# Patient Record
Sex: Male | Born: 1975 | Race: White | Hispanic: No | Marital: Single | Smoking: Never smoker
Health system: Southern US, Community
[De-identification: ages and names within clinical notes are randomized; demographics above are authoritative.]

## PROBLEM LIST (undated history)

## (undated) DIAGNOSIS — R200 Anesthesia of skin: Secondary | ICD-10-CM

## (undated) DIAGNOSIS — Z87442 Personal history of urinary calculi: Secondary | ICD-10-CM

## (undated) DIAGNOSIS — M5020 Other cervical disc displacement, unspecified cervical region: Secondary | ICD-10-CM

## (undated) DIAGNOSIS — N200 Calculus of kidney: Secondary | ICD-10-CM

## (undated) DIAGNOSIS — F32A Depression, unspecified: Secondary | ICD-10-CM

## (undated) DIAGNOSIS — R3129 Other microscopic hematuria: Secondary | ICD-10-CM

## (undated) DIAGNOSIS — I10 Essential (primary) hypertension: Secondary | ICD-10-CM

## (undated) DIAGNOSIS — G935 Compression of brain: Secondary | ICD-10-CM

## (undated) DIAGNOSIS — K649 Unspecified hemorrhoids: Secondary | ICD-10-CM

## (undated) DIAGNOSIS — R202 Paresthesia of skin: Secondary | ICD-10-CM

## (undated) DIAGNOSIS — K921 Melena: Secondary | ICD-10-CM

## (undated) DIAGNOSIS — G4730 Sleep apnea, unspecified: Secondary | ICD-10-CM

## (undated) DIAGNOSIS — K219 Gastro-esophageal reflux disease without esophagitis: Secondary | ICD-10-CM

## (undated) DIAGNOSIS — M5030 Other cervical disc degeneration, unspecified cervical region: Secondary | ICD-10-CM

## (undated) DIAGNOSIS — Z9889 Other specified postprocedural states: Secondary | ICD-10-CM

## (undated) DIAGNOSIS — M542 Cervicalgia: Secondary | ICD-10-CM

## (undated) HISTORY — PX: SUBOCCIPITAL CRANIECTOMY CERVICAL LAMINECTOMY: SHX5404

## (undated) HISTORY — PX: FRACTURE SURGERY: SHX138

## (undated) HISTORY — PX: OTHER SURGICAL HISTORY: SHX169

## (undated) HISTORY — PX: TONSILLECTOMY: SUR1361

## (undated) HISTORY — PX: SPINE SURGERY: SHX786

## (undated) HISTORY — PX: CHOLECYSTECTOMY: SHX55

## (undated) HISTORY — PX: COLONOSCOPY: SHX174

## (undated) HISTORY — PX: MUSCLE BIOPSY: SHX716

## (undated) HISTORY — PX: APPENDECTOMY: SHX54

## (undated) HISTORY — PX: LITHOTRIPSY: SUR834

## (undated) HISTORY — PX: KNEE SURGERY: SHX244

---

## 2009-02-23 ENCOUNTER — Emergency Department (HOSPITAL_BASED_OUTPATIENT_CLINIC_OR_DEPARTMENT_OTHER): Admission: EM | Admit: 2009-02-23 | Discharge: 2009-02-23 | Payer: Self-pay | Admitting: Emergency Medicine

## 2009-03-30 ENCOUNTER — Emergency Department (HOSPITAL_BASED_OUTPATIENT_CLINIC_OR_DEPARTMENT_OTHER): Admission: EM | Admit: 2009-03-30 | Discharge: 2009-03-30 | Payer: Self-pay | Admitting: Emergency Medicine

## 2009-04-01 ENCOUNTER — Ambulatory Visit: Payer: Self-pay | Admitting: Diagnostic Radiology

## 2009-04-01 ENCOUNTER — Ambulatory Visit (HOSPITAL_BASED_OUTPATIENT_CLINIC_OR_DEPARTMENT_OTHER): Admission: RE | Admit: 2009-04-01 | Discharge: 2009-04-01 | Payer: Self-pay | Admitting: Emergency Medicine

## 2009-07-04 ENCOUNTER — Ambulatory Visit (HOSPITAL_COMMUNITY): Admission: RE | Admit: 2009-07-04 | Discharge: 2009-07-04 | Payer: Self-pay | Admitting: Surgery

## 2010-07-13 LAB — URINALYSIS, ROUTINE W REFLEX MICROSCOPIC
Bilirubin Urine: NEGATIVE
Glucose, UA: NEGATIVE mg/dL
Hgb urine dipstick: NEGATIVE
Nitrite: NEGATIVE
Specific Gravity, Urine: 1.025 (ref 1.005–1.030)
Urobilinogen, UA: 0.2 mg/dL (ref 0.0–1.0)
pH: 5.5 (ref 5.0–8.0)

## 2010-07-13 LAB — DIFFERENTIAL
Basophils Absolute: 0 10*3/uL (ref 0.0–0.1)
Eosinophils Absolute: 0 10*3/uL (ref 0.0–0.7)
Lymphocytes Relative: 20 % (ref 12–46)
Monocytes Absolute: 0.5 10*3/uL (ref 0.1–1.0)
Neutro Abs: 5.3 10*3/uL (ref 1.7–7.7)
Neutrophils Relative %: 73 % (ref 43–77)

## 2010-07-13 LAB — COMPREHENSIVE METABOLIC PANEL
Alkaline Phosphatase: 67 U/L (ref 39–117)
BUN: 14 mg/dL (ref 6–23)
CO2: 29 mEq/L (ref 19–32)
Calcium: 9.6 mg/dL (ref 8.4–10.5)
GFR calc non Af Amer: >60 mL/min (ref >60)
Total Bilirubin: 0.9 mg/dL (ref 0.3–1.2)

## 2010-07-13 LAB — CBC
HCT: 47.4 % (ref 39.0–52.0)
RBC: 5.26 MIL/uL (ref 4.22–5.81)
WBC: 7.2 10*3/uL (ref 4.0–10.5)

## 2010-07-13 LAB — LIPASE, BLOOD: Lipase: 40 U/L (ref 23–300)

## 2012-06-23 DIAGNOSIS — K219 Gastro-esophageal reflux disease without esophagitis: Secondary | ICD-10-CM | POA: Insufficient documentation

## 2015-01-04 ENCOUNTER — Encounter: Payer: Self-pay | Admitting: *Deleted

## 2015-01-04 DIAGNOSIS — N200 Calculus of kidney: Secondary | ICD-10-CM | POA: Diagnosis not present

## 2015-01-04 DIAGNOSIS — R109 Unspecified abdominal pain: Secondary | ICD-10-CM | POA: Diagnosis present

## 2015-01-04 LAB — URINALYSIS COMPLETE WITH MICROSCOPIC (ARMC ONLY)
BILIRUBIN URINE: NEGATIVE
GLUCOSE, UA: NEGATIVE mg/dL
KETONES UR: NEGATIVE mg/dL
LEUKOCYTES UA: NEGATIVE
NITRITE: NEGATIVE
Protein, ur: NEGATIVE mg/dL
SPECIFIC GRAVITY, URINE: 1.02 (ref 1.005–1.030)
pH: 5 (ref 5.0–8.0)

## 2015-01-04 MED ORDER — OXYCODONE-ACETAMINOPHEN 5-325 MG PO TABS
1.0000 | ORAL_TABLET | Freq: Once | ORAL | Status: AC
  Administered 2015-01-04: 1 via ORAL
  Filled 2015-01-04: qty 1

## 2015-01-04 NOTE — ED Notes (Signed)
Spoke with Dr Zenda Alpers regarding pt's presenting complaint and UA results; verbal order for Percocet only at this time

## 2015-01-04 NOTE — ED Notes (Signed)
Pt reports flank pain severe since this afternoon. States trying to urinate, but only dribbles come out. Pain to right flank. Hx of kidney stones.

## 2015-01-04 NOTE — ED Notes (Signed)
Pt c/o low midline abd pain that radiates down into penis; pt says last kidney stone was obstructing, 6mm, had to have procedure to open the urethra so the stone would pass;

## 2015-01-05 ENCOUNTER — Emergency Department
Admission: EM | Admit: 2015-01-05 | Discharge: 2015-01-05 | Disposition: A | Discharge status: Home / Self Care | Payer: BLUE CROSS/BLUE SHIELD | Attending: Emergency Medicine | Admitting: Emergency Medicine

## 2015-01-05 ENCOUNTER — Emergency Department: Payer: BLUE CROSS/BLUE SHIELD

## 2015-01-05 DIAGNOSIS — N200 Calculus of kidney: Secondary | ICD-10-CM

## 2015-01-05 DIAGNOSIS — R109 Unspecified abdominal pain: Secondary | ICD-10-CM

## 2015-01-05 LAB — CBC WITH DIFFERENTIAL/PLATELET
Basophils Absolute: 0 10*3/uL (ref 0–0.1)
Basophils Relative: 1 %
Eosinophils Absolute: 0.1 10*3/uL (ref 0–0.7)
Eosinophils Relative: 2 %
HEMATOCRIT: 43.9 % (ref 40.0–52.0)
HEMOGLOBIN: 15 g/dL (ref 13.0–18.0)
LYMPHS ABS: 1.4 10*3/uL (ref 1.0–3.6)
Lymphocytes Relative: 29 %
MCH: 31.1 pg (ref 26.0–34.0)
MCHC: 34.2 g/dL (ref 32.0–36.0)
MCV: 91 fL (ref 80.0–100.0)
MONO ABS: 0.4 10*3/uL (ref 0.2–1.0)
MONOS PCT: 8 %
NEUTROS ABS: 3 10*3/uL (ref 1.4–6.5)
NEUTROS PCT: 60 %
Platelets: 132 10*3/uL — ABNORMAL LOW (ref 150–440)
RBC: 4.83 MIL/uL (ref 4.40–5.90)
RDW: 12.3 % (ref 11.5–14.5)
WBC: 4.9 10*3/uL (ref 3.8–10.6)

## 2015-01-05 LAB — BASIC METABOLIC PANEL
Anion gap: 6 (ref 5–15)
BUN: 17 mg/dL (ref 6–20)
CALCIUM: 9.3 mg/dL (ref 8.9–10.3)
CHLORIDE: 108 mmol/L (ref 101–111)
CO2: 26 mmol/L (ref 22–32)
CREATININE: 0.87 mg/dL (ref 0.61–1.24)
GFR calc Af Amer: >60 mL/min (ref >60)
GFR calc non Af Amer: >60 mL/min (ref >60)
GLUCOSE: 92 mg/dL (ref 65–99)
Potassium: 4.2 mmol/L (ref 3.5–5.1)
Sodium: 140 mmol/L (ref 135–145)

## 2015-01-05 MED ORDER — TAMSULOSIN HCL 0.4 MG PO CAPS
0.4000 mg | ORAL_CAPSULE | Freq: Once | ORAL | Status: AC
  Administered 2015-01-05: 0.4 mg via ORAL
  Filled 2015-01-05: qty 1

## 2015-01-05 MED ORDER — HYDROMORPHONE HCL 1 MG/ML IJ SOLN
0.5000 mg | Freq: Once | INTRAMUSCULAR | Status: AC
  Administered 2015-01-05: 0.5 mg via INTRAVENOUS

## 2015-01-05 MED ORDER — ONDANSETRON HCL 4 MG/2ML IJ SOLN
4.0000 mg | Freq: Once | INTRAMUSCULAR | Status: AC
  Administered 2015-01-05: 4 mg via INTRAVENOUS

## 2015-01-05 MED ORDER — TAMSULOSIN HCL 0.4 MG PO CAPS: 0.4000 mg | ORAL_CAPSULE | Freq: Every day | ORAL | Status: DC

## 2015-01-05 MED ORDER — KETOROLAC TROMETHAMINE 30 MG/ML IJ SOLN
30.0000 mg | Freq: Once | INTRAMUSCULAR | Status: AC
  Administered 2015-01-05: 30 mg via INTRAVENOUS
  Filled 2015-01-05: qty 1

## 2015-01-05 MED ORDER — SODIUM CHLORIDE 0.9 % IV BOLUS (SEPSIS)
1000.0000 mL | Freq: Once | INTRAVENOUS | Status: AC
  Administered 2015-01-05: 1000 mL via INTRAVENOUS

## 2015-01-05 MED ORDER — IBUPROFEN 800 MG PO TABS: 800.0000 mg | ORAL_TABLET | Freq: Three times a day (TID) | ORAL | Status: DC | PRN

## 2015-01-05 MED ORDER — ONDANSETRON HCL 4 MG PO TABS: 4.0000 mg | ORAL_TABLET | Freq: Three times a day (TID) | ORAL | Status: DC | PRN

## 2015-01-05 MED ORDER — OXYCODONE-ACETAMINOPHEN 5-325 MG PO TABS: 1.0000 | ORAL_TABLET | ORAL | Status: DC | PRN

## 2015-01-05 NOTE — Discharge Instructions (Signed)
1. Take pain & nausea medicines as needed (Percocet/Zofran #20). Make sure to take a stool softener while taking narcotic pain medicines. 2. Take Flomax 0.4mg  daily x 14 days. 3. Drink plenty of bottled or filtered water daily. 4. Return to the ER for worsening symptoms, persistent vomiting, fever, difficulty breathing or other concerns.   Kidney Stones Kidney stones (urolithiasis) are deposits that form inside your kidneys. The intense pain is caused by the stone moving through the urinary tract. When the stone moves, the ureter goes into spasm around the stone. The stone is usually passed in the urine.  CAUSES   A disorder that makes certain neck glands produce too much parathyroid hormone (primary hyperparathyroidism).  A buildup of uric acid crystals, similar to gout in your joints.  Narrowing (stricture) of the ureter.  A kidney obstruction present at birth (congenital obstruction).  Previous surgery on the kidney or ureters.  Numerous kidney infections. SYMPTOMS   Feeling sick to your stomach (nauseous).  Throwing up (vomiting).  Blood in the urine (hematuria).  Pain that usually spreads (radiates) to the groin.  Frequency or urgency of urination. DIAGNOSIS   Taking a history and physical exam.  Blood or urine tests.  CT scan.  Occasionally, an examination of the inside of the urinary bladder (cystoscopy) is performed. TREATMENT   Observation.  Increasing your fluid intake.  Extracorporeal shock wave lithotripsy--This is a noninvasive procedure that uses shock waves to break up kidney stones.  Surgery may be needed if you have severe pain or persistent obstruction. There are various surgical procedures. Most of the procedures are performed with the use of small instruments. Only small incisions are needed to accommodate these instruments, so recovery time is minimized. The size, location, and chemical composition are all important variables that will determine  the proper choice of action for you. Talk to your health care provider to better understand your situation so that you will minimize the risk of injury to yourself and your kidney.  HOME CARE INSTRUCTIONS   Drink enough water and fluids to keep your urine clear or pale yellow. This will help you to pass the stone or stone fragments.  Strain all urine through the provided strainer. Keep all particulate matter and stones for your health care provider to see. The stone causing the pain may be as small as a grain of salt. It is very important to use the strainer each and every time you pass your urine. The collection of your stone will allow your health care provider to analyze it and verify that a stone has actually passed. The stone analysis will often identify what you can do to reduce the incidence of recurrences.  Only take over-the-counter or prescription medicines for pain, discomfort, or fever as directed by your health care provider.  Make a follow-up appointment with your health care provider as directed.  Get follow-up X-rays if required. The absence of pain does not always mean that the stone has passed. It may have only stopped moving. If the urine remains completely obstructed, it can cause loss of kidney function or even complete destruction of the kidney. It is your responsibility to make sure X-rays and follow-ups are completed. Ultrasounds of the kidney can show blockages and the status of the kidney. Ultrasounds are not associated with any radiation and can be performed easily in a matter of minutes. SEEK MEDICAL CARE IF:  You experience pain that is progressive and unresponsive to any pain medicine you have been prescribed.  SEEK IMMEDIATE MEDICAL CARE IF:   Pain cannot be controlled with the prescribed medicine.  You have a fever or shaking chills.  The severity or intensity of pain increases over 18 hours and is not relieved by pain medicine.  You develop a new onset of  abdominal pain.  You feel faint or pass out.  You are unable to urinate. MAKE SURE YOU:   Understand these instructions.  Will watch your condition.  Will get help right away if you are not doing well or get worse. Document Released: 03/29/2005 Document Revised: 11/29/2012 Document Reviewed: 08/30/2012 [facility] Patient Information 2015 [place], [place]. This information is not intended to replace advice given to you by your health care provider. Make sure you discuss any questions you have with your health care provider.

## 2015-01-05 NOTE — ED Provider Notes (Signed)
[facility] Emergency Department Provider Note  ____________________________________________  Time seen: Approximately 12:53 AM  I have reviewed the triage vital signs and the nursing notes.   HISTORY  Chief Complaint Flank Pain    HPI Alexander Walters is a 39 y.o. male who presents to the ED from home with a chief complaint of low midline pain that radiates into his groin. Patient has a history of kidney stones; last stone approximately 6 mm and patient required urethral dilation. Patient has been experiencing right flank pain for the past 3 days, waxing and waning. Yesterday the sharp, stabbing pain radiated into his abdomen. Tonight patient experienced severe pain in his suprapubic region and feels like he is trying to pass a stone. States he tries to urinate but only dribbles. Denies fever, chills, chest pain, shortness of breath, nausea, vomiting, diarrhea, constipation. Denies recent travel or trauma.   Past medical history Nephrolithiasis   There are no active problems to display for this patient.   Past Surgical History  Procedure Laterality Date  . Cholecystectomy    . Knee surgery    . Appendectomy      No current outpatient prescriptions on file.  Allergies Review of patient's allergies indicates no known allergies.  No family history on file.  Social History Social History  Substance Use Topics  . Smoking status: Never Smoker   . Smokeless tobacco: Current User    Types: Chew  . Alcohol Use: No    Review of Systems Constitutional: No fever/chills Eyes: No visual changes. ENT: No sore throat. Cardiovascular: Denies chest pain. Respiratory: Denies shortness of breath. Gastrointestinal: Positive for suprapubic abdominal pain.  No nausea, no vomiting.  No diarrhea.  No constipation. Genitourinary: Positive for hesitancy. Negative for dysuria. Musculoskeletal: Positive for back pain. Skin: Negative for rash. Neurological: Negative  for headaches, focal weakness or numbness.  10-point ROS otherwise negative.  ____________________________________________   PHYSICAL EXAM:  VITAL SIGNS: ED Triage Vitals  Enc Vitals Group     BP 01/04/15 2156 123/82 mmHg     Pulse Rate 01/04/15 2156 60     Resp 01/04/15 2156 16     Temp --      Temp src --      SpO2 01/04/15 2156 96 %     Weight 01/04/15 2156 205 lb (92.987 kg)     Height 01/04/15 2156  (1.702 m)     Head Cir --      Peak Flow --      Pain Score 01/04/15 2154 7     Pain Loc --      Pain Edu? --      Excl. in GC? --     Constitutional: Alert and oriented. Well appearing and in no acute distress. Eyes: Conjunctivae are normal. PERRL. EOMI. Head: Atraumatic. Nose: No congestion/rhinnorhea. Mouth/Throat: Mucous membranes are moist.  Oropharynx non-erythematous. Neck: No stridor.   Cardiovascular: Normal rate, regular rhythm. Grossly normal heart sounds.  Good peripheral circulation. Respiratory: Normal respiratory effort.  No retractions. Lungs CTAB. Gastrointestinal: Soft and minimally tender to palpation suprapubic area without rebound or guarding. No distention. No abdominal bruits. No CVA tenderness. Genitourinary: Circumcised. No testicular masses, tenderness or swelling. Strong cremaster reflexes bilaterally. Musculoskeletal: No lower extremity tenderness nor edema.  No joint effusions. Neurologic:  Normal speech and language. No gross focal neurologic deficits are appreciated. No gait instability. Skin:  Skin is warm, dry and intact. No rash noted. Psychiatric: Mood and affect are  normal. Speech and behavior are normal.  ____________________________________________   LABS (all labs ordered are listed, but only abnormal results are displayed)  Labs Reviewed  URINALYSIS COMPLETEWITH MICROSCOPIC ([facility] ONLY) - Abnormal; Notable for the following:    Color, Urine YELLOW (*)    APPearance CLEAR (*)    Hgb urine dipstick 3+ (*)    Bacteria, UA  RARE (*)    Squamous Epithelial / LPF 0-5 (*)    All other components within normal limits  CBC WITH DIFFERENTIAL/PLATELET - Abnormal; Notable for the following:    Platelets 132 (*)    All other components within normal limits  BASIC METABOLIC PANEL   ____________________________________________  EKG  None ____________________________________________  RADIOLOGY  CT renal stone study interpreted per Dr. Andria Meuse: 3.8 mm stone in the left side of the bladder may be in the left ureterovesical junction or recently passed into the bladder. No significant obstruction. Multiple nonobstructing stones in both kidneys. ____________________________________________   PROCEDURES  Procedure(s) performed: None  Critical Care performed: No  ____________________________________________   INITIAL IMPRESSION / ASSESSMENT AND PLAN / ED COURSE  Pertinent labs & imaging results that were available during my care of the patient were reviewed by me and considered in my medical decision making (see chart for details).  39 year old male who presents with suprapubic pain, thinks he passed a kidney stone into his bladder; history of stones. Will initiate IV fluid resuscitation, IV analgesic, check basic labs and reassess. Awaiting CT scan results.  ----------------------------------------- 3:02 AM on 01/05/2015 -----------------------------------------  Patient improved. Updated patient and spouse of CT results. Will add toradol and flomax. Follow up urology. Strict return precautions given. Both verbalize understanding and agree with plan of care. ____________________________________________   FINAL CLINICAL IMPRESSION(S) / ED DIAGNOSES  Final diagnoses:  Kidney stone      Irean Hong, MD 01/05/15 [phone]

## 2015-01-13 ENCOUNTER — Encounter: Payer: Self-pay | Admitting: Emergency Medicine

## 2015-01-13 ENCOUNTER — Emergency Department
Admission: EM | Admit: 2015-01-13 | Discharge: 2015-01-14 | Disposition: A | Discharge status: Home / Self Care | Payer: BLUE CROSS/BLUE SHIELD | Attending: Emergency Medicine | Admitting: Emergency Medicine

## 2015-01-13 ENCOUNTER — Emergency Department: Payer: BLUE CROSS/BLUE SHIELD

## 2015-01-13 DIAGNOSIS — M5441 Lumbago with sciatica, right side: Secondary | ICD-10-CM

## 2015-01-13 DIAGNOSIS — M545 Low back pain: Secondary | ICD-10-CM | POA: Diagnosis present

## 2015-01-13 DIAGNOSIS — Z79899 Other long term (current) drug therapy: Secondary | ICD-10-CM | POA: Insufficient documentation

## 2015-01-13 DIAGNOSIS — M5431 Sciatica, right side: Secondary | ICD-10-CM | POA: Insufficient documentation

## 2015-01-13 HISTORY — DX: Calculus of kidney: N20.0

## 2015-01-13 HISTORY — DX: Other cervical disc displacement, unspecified cervical region: M50.20

## 2015-01-13 MED ORDER — ORPHENADRINE CITRATE 30 MG/ML IJ SOLN
60.0000 mg | INTRAMUSCULAR | Status: AC
  Administered 2015-01-13: 60 mg via INTRAMUSCULAR
  Filled 2015-01-13: qty 2

## 2015-01-13 MED ORDER — KETOROLAC TROMETHAMINE 60 MG/2ML IM SOLN
60.0000 mg | Freq: Once | INTRAMUSCULAR | Status: AC
  Administered 2015-01-13: 60 mg via INTRAMUSCULAR
  Filled 2015-01-13: qty 2

## 2015-01-13 NOTE — ED Provider Notes (Signed)
[facility] Emergency Department Provider Note ____________________________________________  Time seen: 2230  I have reviewed the triage vital signs and the nursing notes.  HISTORY  Chief Complaint  Back Pain  HPI Alexander Walters is a 39 y.o. male reports to the ED with complaint of back pain with onset last night. He notes primarily relatively referral pain after he bent over at home. He does ibuprofen, apply heat to the back without much improvement of his symptoms. 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 He denies any weakness, foot drop, or incontinence. He reports pain is like a tightness in the back causing him to not be able to fully extend the back without pain.  Past Medical History  Diagnosis Date  . Herniated cervical disc   . Kidney stones    There are no active problems to display for this patient.  Past Surgical History  Procedure Laterality Date  . Cholecystectomy    . Knee surgery    . Appendectomy      Current Outpatient Rx  Name  Route  Sig  Dispense  Refill  . ibuprofen (ADVIL,MOTRIN) 800 MG tablet   Oral   Take 1 tablet (800 mg total) by mouth every 8 (eight) hours as needed for moderate pain.   15 tablet   0   . ondansetron (ZOFRAN) 4 MG tablet   Oral   Take 1 tablet (4 mg total) by mouth every 8 (eight) hours as needed for nausea or vomiting.   20 tablet   1   . oxyCODONE-acetaminophen (ROXICET) 5-325 MG per tablet   Oral   Take 1 tablet by mouth every 4 (four) hours as needed for severe pain.   20 tablet   0   . predniSONE (DELTASONE) 10 MG tablet   Oral   Take 1 tablet (10 mg total) by mouth 2 (two) times daily with a meal.   10 tablet   0   . tamsulosin (FLOMAX) 0.4 MG CAPS capsule   Oral   Take 1 capsule (0.4 mg total) by mouth daily.   14 capsule   0    Allergies Review of patient's allergies indicates no known allergies.  History reviewed. No pertinent family history.  Social  History Social History  Substance Use Topics  . Smoking status: Never Smoker   . Smokeless tobacco: Current User    Types: Chew  . Alcohol Use: No   Review of Systems  Constitutional: Negative for fever. Eyes: Negative for visual changes. ENT: Negative for sore throat. Cardiovascular: Negative for chest pain. Respiratory: Negative for shortness of breath. Gastrointestinal: Negative for abdominal pain, vomiting and diarrhea. Genitourinary: Negative for dysuria. Musculoskeletal: Positive for back pain. Skin: Negative for rash. Neurological: Negative for headaches, focal weakness or numbness. ____________________________________________  PHYSICAL EXAM:  VITAL SIGNS: ED Triage Vitals  Enc Vitals Group     BP 01/13/15 2120 125/74 mmHg     Pulse Rate 01/13/15 2120 57     Resp 01/13/15 2120 18     Temp 01/13/15 2120 97.8 F (36.6 C)     Temp Source 01/13/15 2120 Oral     SpO2 01/13/15 2120 96 %     Weight 01/13/15 2120 205 lb (92.987 kg)     Height 01/13/15 [name]      Peak Flow --      Pain Score 01/13/15 2121 8  Pain Loc --      Pain Edu? --      Excl. in GC? --     Constitutional: Alert and oriented. Well appearing and in no distress. Eyes: Conjunctivae are normal. PERRL. Normal extraocular movements. ENT   Head: Normocephalic and atraumatic.   Nose: No congestion/rhinorrhea.   Mouth/Throat: Mucous membranes are moist.   Neck: Supple. No thyromegaly. Hematological/Lymphatic/Immunological: No cervical lymphadenopathy. Cardiovascular: Normal rate, regular rhythm.  Respiratory: Normal respiratory effort. No wheezes/rales/rhonchi. Gastrointestinal: Soft and nontender. No distention. Musculoskeletal:Normal spinal alignment without midline tenderness, spasm, step-off, or deformity. Patient with decreased lumbar extension secondary to pain. He is noted to have a negative seated straight leg raise, and normal hip flexion.  Nontender with normal range of motion in all extremities.  Neurologic:  Cranial nerves II through XII grossly intact. Normal lower extremity DTRs bilaterally. Normal toe dorsiflexion, and plantarflexion. Normal gait without ataxia. Normal speech and language. No gross focal neurologic deficits are appreciated. Skin:  Skin is warm, dry and intact. No rash noted. Psychiatric: Mood and affect are normal. Patient exhibits appropriate insight and judgment. ____________________________________________   RADIOLOGY LS Spine IMPRESSION: No evidence of fracture or subluxation along the lumbar spine.  I, Alexander Walters, Charlesetta Ivory, personally viewed and evaluated these images (plain radiographs) as part of my medical decision making.  ____________________________________________  PROCEDURES  Toradol 60 mg IM Norflex 60 mg IM ____________________________________________  INITIAL IMPRESSION / ASSESSMENT AND PLAN / ED COURSE  Patient radiology results reviewed, no indication on plain films of significant degenerative disc disease or other chronic arthritic changes of lumbar spine. Patient will be discharged with prescription for prednisone for acute pain relief. He has declined any prescription for anti-spasms or narcotics. He will be followed by Dr. Rosita Kea ongoing for worsening symptoms. ____________________________________________  FINAL CLINICAL IMPRESSION(S) / ED DIAGNOSES  Final diagnoses:  Right-sided low back pain with right-sided sciatica      Lissa Hoard, PA-C 01/14/15 0031  Jennye Moccasin, MD 01/14/15 [phone]

## 2015-01-13 NOTE — ED Notes (Signed)
Pt arrived to the ED via wheel chair accompanied by his wife for complaints of lower back pain that does not allow him to walk. Pt reports having 2 herniated disks (L4 & L5) and last time that something like this happened they gave him a cortisone shot. Pt is AOx4 in no apparent distress.

## 2015-01-14 MED ORDER — PREDNISONE 10 MG PO TABS: 10.0000 mg | ORAL_TABLET | Freq: Two times a day (BID) | ORAL | Status: DC

## 2015-01-14 NOTE — ED Notes (Signed)
Patient discharged to home per MD order. Patient in stable condition, and deemed medically cleared by ED provider for discharge. Discharge instructions reviewed with patient/family using "Teach Back"; verbalized understanding of medication education and administration, and information about follow-up care. Denies further concerns. ° °

## 2015-01-14 NOTE — Discharge Instructions (Signed)
Back Pain, Adult Back pain is very common. The pain often gets better over time. The cause of back pain is usually not dangerous. Most people can learn to manage their back pain on their own.  HOME CARE   Stay active. Start with short walks on flat ground if you can. Try to walk farther each day.  Do not sit, drive, or stand in one place for more than 30 minutes. Do not stay in bed.  Do not avoid exercise or work. Activity can help your back heal faster.  Be careful when you bend or lift an object. Bend at your knees, keep the object close to you, and do not twist.  Sleep on a firm mattress. Lie on your side, and bend your knees. If you lie on your back, put a pillow under your knees.  Only take medicines as told by your doctor.  Put ice on the injured area.  Put ice in a plastic bag.  Place a towel between your skin and the bag.  Leave the ice on for 15-20 minutes, 03-04 times a day for the first 2 to 3 days. After that, you can switch between ice and heat packs.  Ask your doctor about back exercises or massage.  Avoid feeling anxious or stressed. Find good ways to deal with stress, such as exercise. GET HELP RIGHT AWAY IF:   Your pain does not go away with rest or medicine.  Your pain does not go away in 1 week.  You have new problems.  You do not feel well.  The pain spreads into your legs.  You cannot control when you poop (bowel movement) or pee (urinate).  Your arms or legs feel weak or lose feeling (numbness).  You feel sick to your stomach (nauseous) or throw up (vomit).  You have belly (abdominal) pain.  You feel like you may pass out (faint). MAKE SURE YOU:   Understand these instructions.  Will watch your condition.  Will get help right away if you are not doing well or get worse. Document Released: 09/15/2007 Document Revised: 06/21/2011 Document Reviewed: 07/31/2013 [facility] Patient Information 2015 [place], [place]. This information is not intended  to replace advice given to you by your health care provider. Make sure you discuss any questions you have with your health care provider.  Sciatica Sciatica is pain, weakness, numbness, or tingling along your sciatic nerve. The nerve starts in the lower back and runs down the back of each leg. Nerve damage or certain conditions pinch or put pressure on the sciatic nerve. This causes the pain, weakness, and other discomforts of sciatica. HOME CARE   Only take medicine as told by your doctor.  Apply ice to the affected area for 20 minutes. Do this 3-4 times a day for the first 48-72 hours. Then try heat in the same way.  Exercise, stretch, or do your usual activities if these do not make your pain worse.  Go to physical therapy as told by your doctor.  Keep all doctor visits as told.  Do not wear high heels or shoes that are not supportive.  Get a firm mattress if your mattress is too soft to lessen pain and discomfort. GET HELP RIGHT AWAY IF:   You cannot control when you poop (bowel movement) or pee (urinate).  You have more weakness in your lower back, lower belly (pelvis), butt (buttocks), or legs.  You have redness or puffiness (swelling) of your back.  You have a burning feeling  when you pee.  You have pain that gets worse when you lie down.  You have pain that wakes you from your sleep.  Your pain is worse than past pain.  Your pain lasts longer than 4 weeks.  You are suddenly losing weight without reason. MAKE SURE YOU:   Understand these instructions.  Will watch this condition.  Will get help right away if you are not doing well or get worse. Document Released: 01/06/2008 Document Revised: 09/28/2011 Document Reviewed: 08/08/2011 [facility] Patient Information 2015 [place], [place]. This information is not intended to replace advice given to you by your health care provider. Make sure you discuss any questions you have with your health care provider.  Take the  steroid as directed. Follow-up with Dr. Rosita Kea for recurrent or ongoing symptoms.

## 2016-12-02 IMAGING — CT CT RENAL STONE PROTOCOL
1 of 2 series · 15 of 32 positions shown, 19 images · non-contrast
Comparison: None.

CLINICAL DATA: Severe flank pain since this afternoon. Difficulty
urinating. Pain in the right flank. History of kidney stones.

EXAM:
CT ABDOMEN AND PELVIS WITHOUT CONTRAST
TECHNIQUE: Multidetector CT imaging of the abdomen and pelvis was performed
following the standard protocol without IV contrast.

[Series 2: stone standard full · axial · 0.74mm/px · z∈[-486,-56]mm · 15 of 96 slices shown, 19 images]
[im 5/96  soft-tissue]
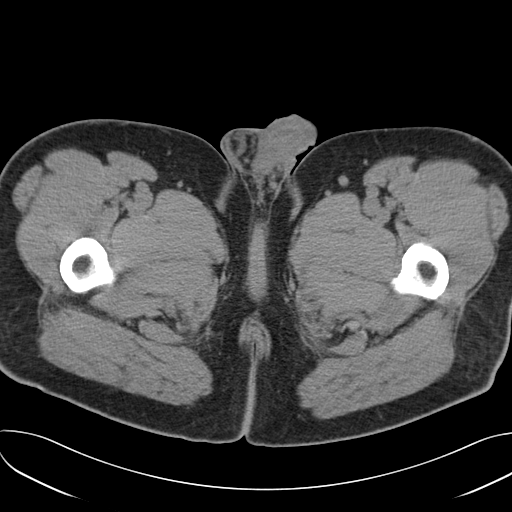
[im 5/96  bone]
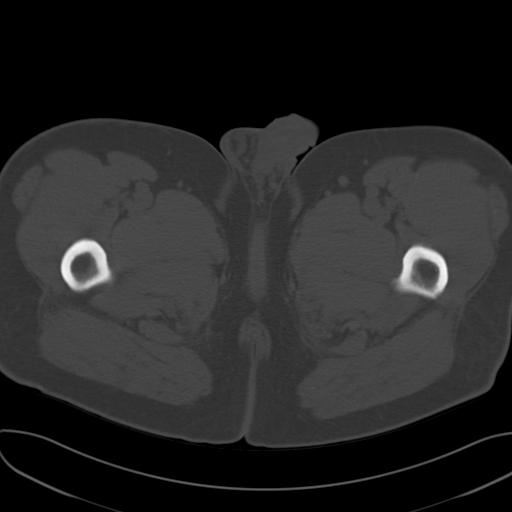
[im 13/96  soft-tissue]
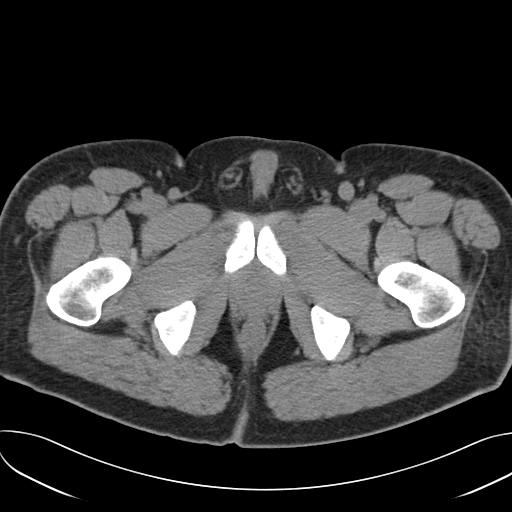
[im 21/96  soft-tissue]
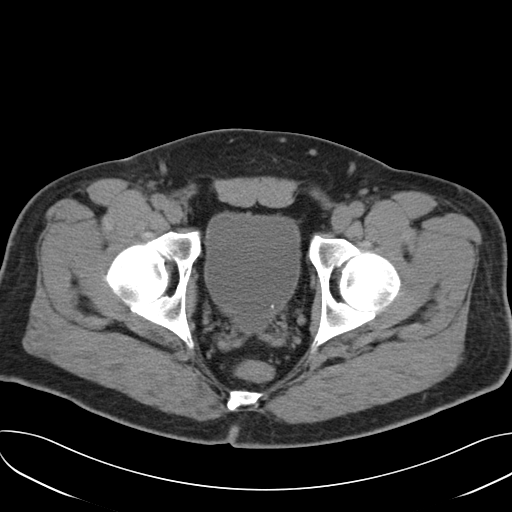
[im 25/96  soft-tissue]
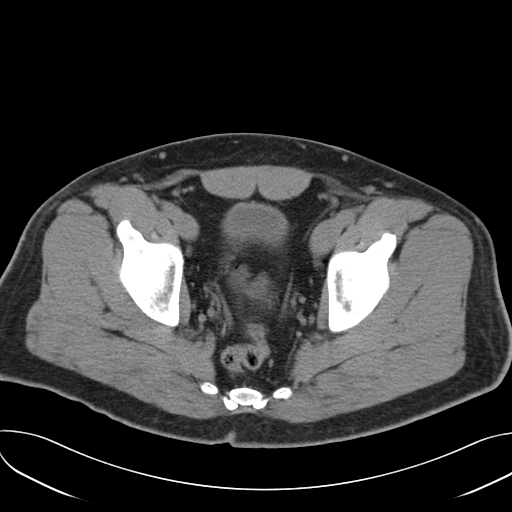
[im 34/96  soft-tissue]
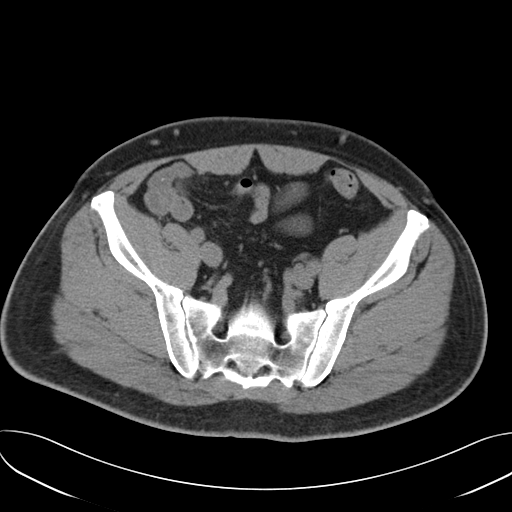
[im 42/96  soft-tissue]
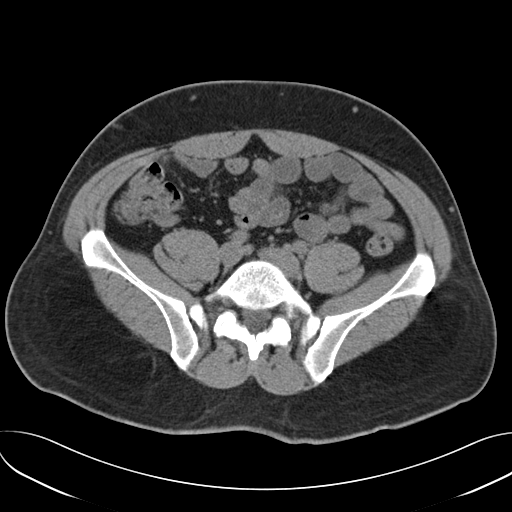
[im 50/96  soft-tissue]
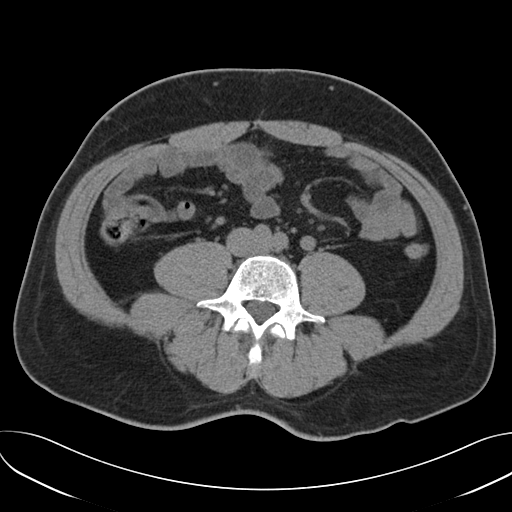
[im 54/96  soft-tissue]
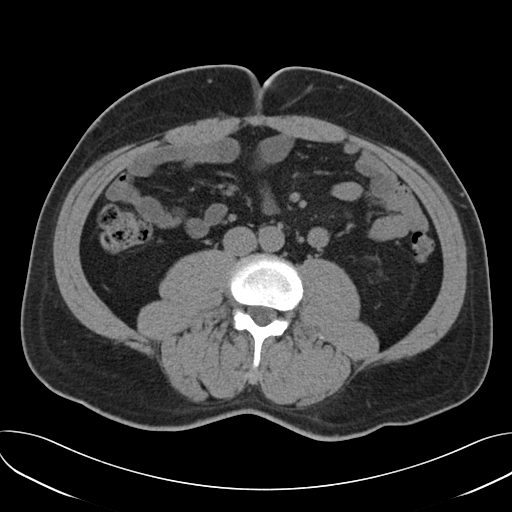
[im 62/96  soft-tissue]
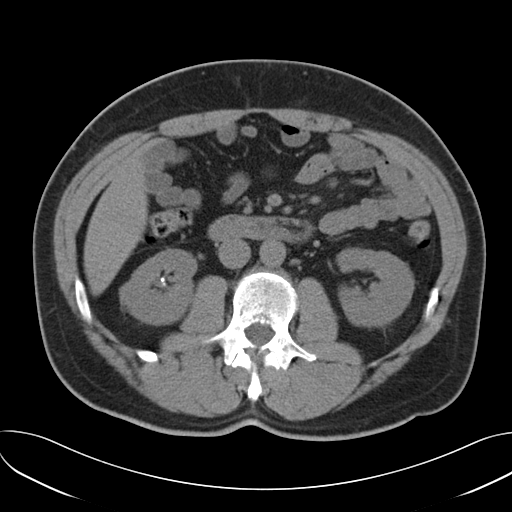
[im 62/96  bone]
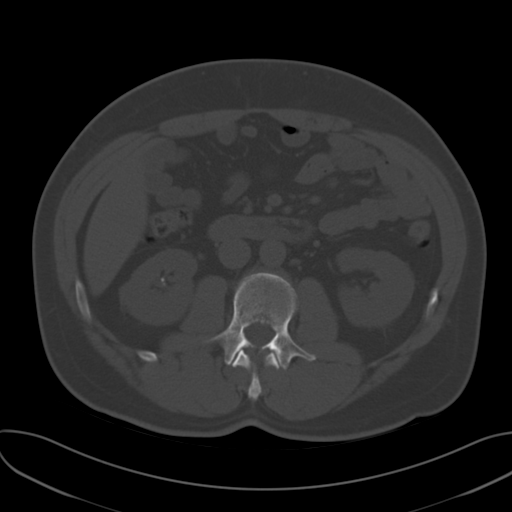
[im 71/96  soft-tissue]
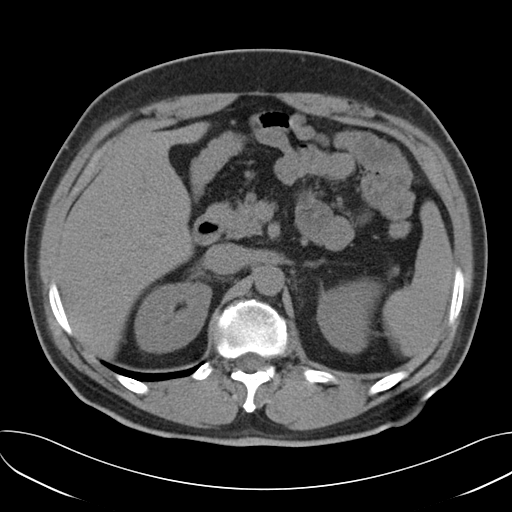
[im 75/96  soft-tissue]
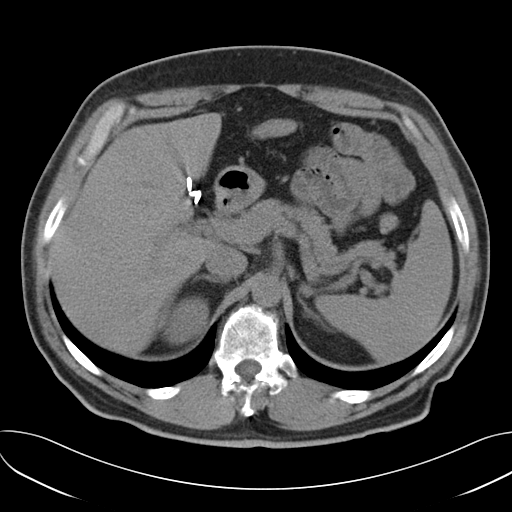
[im 79/96  lung]
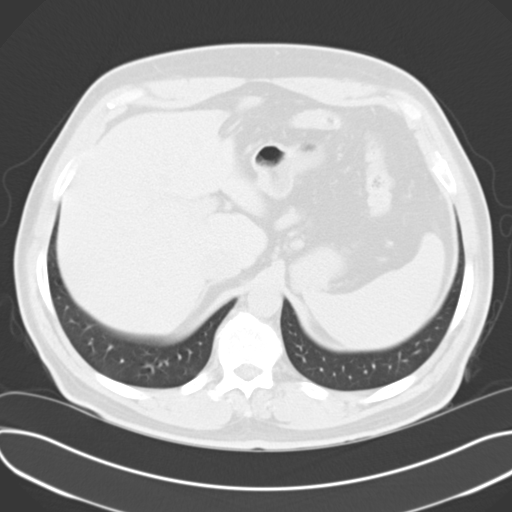
[im 83/96  soft-tissue]
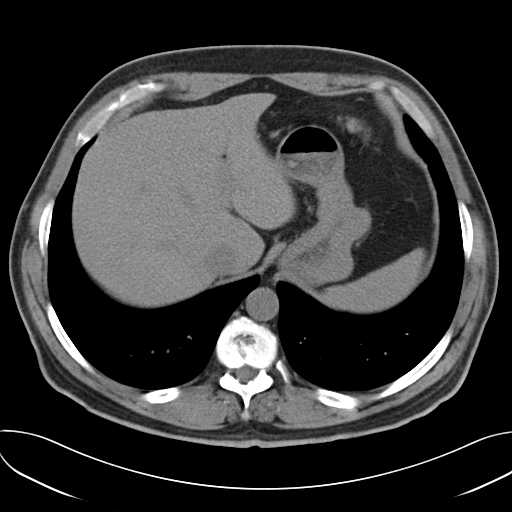
[im 83/96  lung]
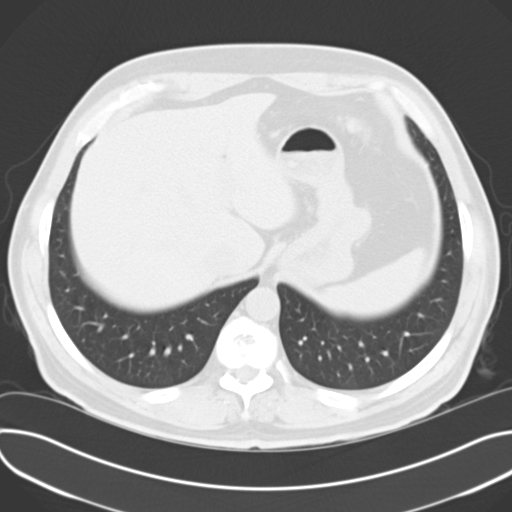
[im 87/96  lung]
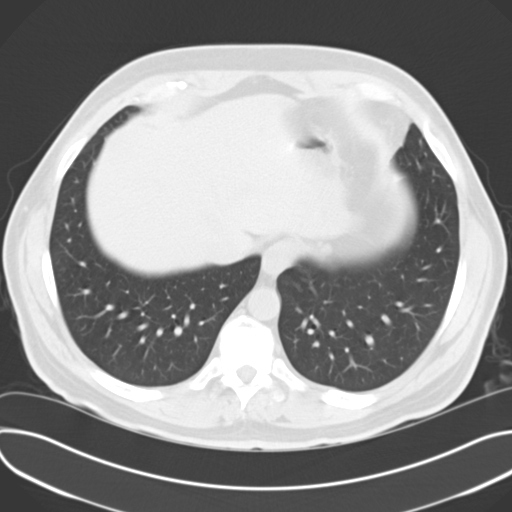
[im 91/96  soft-tissue]
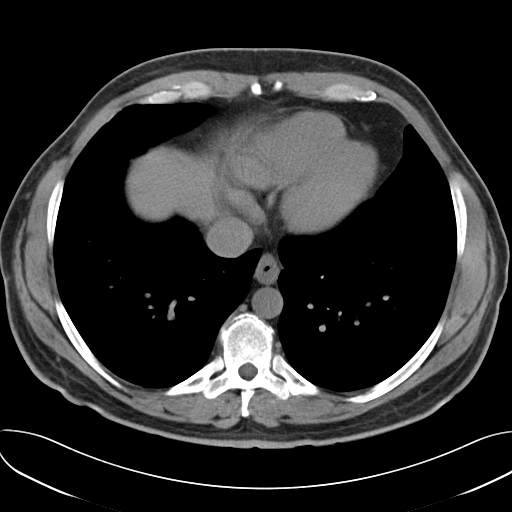
[im 91/96  lung]
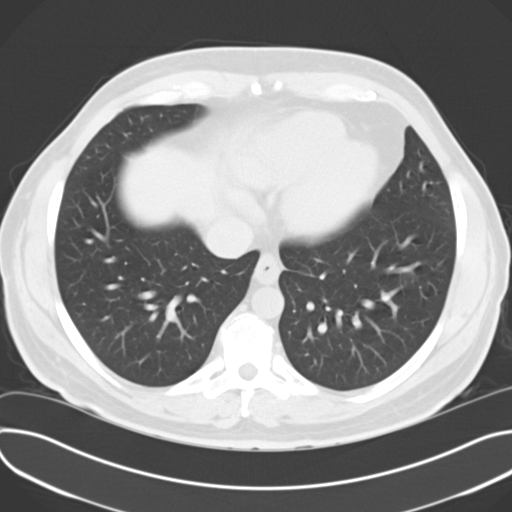

[15 of 32 positions shown; findings below may reference images not displayed]

FINDINGS: Lung bases are clear. Thickening of the distal esophageal wall may
indicate reflux disease.

Scattered stones in both kidneys. 3.8 mm stone in the left side of
the bladder posteriorly may be in the left ureterovesical junction
or recently passed into the bladder. There is no significant
hydronephrosis or hydroureter on the left to suggest obstruction. No
hydronephrosis or hydroureter on the right either. Bladder wall is
not thickened.

Surgical absence of the gallbladder. Unenhanced appearance of the
liver, spleen, pancreas, adrenal glands, abdominal aorta, inferior
vena cava, and retroperitoneal lymph nodes is unremarkable. Stomach,
small bowel, and colon are not abnormally distended. No free air or
free fluid in the abdomen.

Pelvis: Appendix is not identified. No free or loculated pelvic
fluid collections. No pelvic mass or lymphadenopathy. Prostate gland
is mildly enlarged with calcification. Degenerative changes in
spine.
IMPRESSION: 3.8 mm stone in the left side of the bladder may be in the left
ureterovesical junction or recently passed into the bladder. No
significant obstruction. Multiple nonobstructing stones in both
kidneys.

## 2018-01-16 ENCOUNTER — Other Ambulatory Visit: Payer: Self-pay

## 2018-01-16 ENCOUNTER — Ambulatory Visit
Admission: EM | Admit: 2018-01-16 | Discharge: 2018-01-16 | Disposition: A | Discharge status: Home / Self Care | Payer: BLUE CROSS/BLUE SHIELD | Attending: Family Medicine | Admitting: Family Medicine

## 2018-01-16 DIAGNOSIS — R05 Cough: Secondary | ICD-10-CM

## 2018-01-16 DIAGNOSIS — R69 Illness, unspecified: Secondary | ICD-10-CM | POA: Diagnosis not present

## 2018-01-16 DIAGNOSIS — R509 Fever, unspecified: Secondary | ICD-10-CM

## 2018-01-16 DIAGNOSIS — J111 Influenza due to unidentified influenza virus with other respiratory manifestations: Secondary | ICD-10-CM

## 2018-01-16 DIAGNOSIS — R1110 Vomiting, unspecified: Secondary | ICD-10-CM

## 2018-01-16 MED ORDER — OSELTAMIVIR PHOSPHATE 75 MG PO CAPS: 75.0000 mg | ORAL_CAPSULE | Freq: Two times a day (BID) | ORAL | 0 refills | Status: DC

## 2018-01-16 NOTE — ED Provider Notes (Signed)
[facility]   CSN: [id number] Arrival date & time: 01/16/18  1432  History   Chief Complaint Chief Complaint  Patient presents with  . Fever   HPI  42 year old male presents with fever.  Patient reports he has been sick since Friday to Saturday.  Fever, chills, body aches, cough, one episode of emesis.  Fever, T-max 102.Marland Kitchen He has been taking Mucinex and Tylenol and ibuprofen without resolution.  No reported sick contacts.  Symptoms are severe.  No known exacerbating factors.  No other associated symptoms.  No other complaints.  PMH, Surgical Hx, Family Hx, Social History reviewed and updated as below.  Past Medical History:  Diagnosis Date  . Herniated cervical disc   . Kidney stones    Past Surgical History:  Procedure Laterality Date  . APPENDECTOMY    . CHOLECYSTECTOMY    . KNEE SURGERY     Home Medications    Prior to Admission medications   Medication Sig Start Date End Date Taking? Authorizing Provider  cetirizine (ZYRTEC) 10 MG chewable tablet Chew 10 mg by mouth daily.   Yes [provider]  ibuprofen (ADVIL,MOTRIN) 800 MG tablet Take 1 tablet (800 mg total) by mouth every 8 (eight) hours as needed for moderate pain. 01/05/15  Yes Irean Hong, MD  oseltamivir (TAMIFLU) 75 MG capsule Take 1 capsule (75 mg total) by mouth every 12 (twelve) hours. 01/16/18   Tommie Sams, DO   Family History Family History  Problem Relation Age of Onset  . Heart disease Father    Social History Social History   Tobacco Use  . Smoking status: Never Smoker  . Smokeless tobacco: Current User    Types: Chew  Substance Use Topics  . Alcohol use: Yes    Comment: occasionally once a week  . Drug use: No   Allergies   Patient has no known allergies.  Review of Systems Review of Systems Per HPI  Physical Exam Triage Vital Signs ED Triage Vitals  Enc Vitals Group     BP 01/16/18 1502 (!) 153/91     Pulse Rate 01/16/18 1502 78     Resp 01/16/18 1502 18      Temp 01/16/18 1502 98.3 F (36.8 C)     Temp Source 01/16/18 1502 Oral     SpO2 01/16/18 1502 97 %     Weight 01/16/18 1459 220 lb (99.8 kg)     Height 01/16/18 1459 5\' 7"  (1.702 m)     Head Circumference --      Peak Flow --      Pain Score 01/16/18 1459 5     Pain Loc --      Pain Edu? --      Excl. in [place]? --    Updated Vital Signs BP (!) 153/91 (BP Location: Left Arm)   Pulse 78   Temp 98.3 F (36.8 C) (Oral)   Resp 18   Ht 5\' 7"  (1.702 m)   Wt 99.8 kg   SpO2 97%   BMI 34.46 kg/m   Visual Acuity Right Eye Distance:   Left Eye Distance:   Bilateral Distance:    Right Eye Near:   Left Eye Near:    Bilateral Near:     Physical Exam  Constitutional: He is oriented to person, place, and time. He appears well-developed. No distress.  HENT:  Head: Normocephalic and atraumatic.  Mouth/Throat: Oropharynx is clear and moist.  Cardiovascular: Normal rate and regular rhythm.  Pulmonary/Chest: Effort normal and breath sounds normal. He has no wheezes. He has no rales.  Neurological: He is alert and oriented to person, place, and time.  Psychiatric: He has a normal mood and affect. His behavior is normal.  Nursing note and vitals reviewed.  UC Treatments / Results  Labs (all labs ordered are listed, but only abnormal results are displayed) Labs Reviewed - No data to display  EKG None  Radiology No results found.  Procedures Procedures (including critical care time)  Medications Ordered in UC Medications - No data to display  Initial Impression / Assessment and Plan / UC Course  I have reviewed the triage vital signs and the nursing notes.  Pertinent labs & imaging results that were available during my care of the patient were reviewed by me and considered in my medical decision making (see chart for details).    42 year old male presents with suspected influenza.  Treating with Tamiflu.  Rest, fluids, and supportive care.  Work note given.  Final  Clinical Impressions(s) / UC Diagnoses   Final diagnoses:  Influenza-like illness     Discharge Instructions     Suspect flu.  Medication as prescribed.  Rest, fluids.  Take care  Dr. Adriana Simas    ED Prescriptions    Medication Sig Dispense Auth. Provider   oseltamivir (TAMIFLU) 75 MG capsule Take 1 capsule (75 mg total) by mouth every 12 (twelve) hours. 10 capsule Tommie Sams, DO     Controlled Substance Prescriptions [place] Controlled Substance Registry consulted? Not Applicable   Tommie Sams, DO 01/16/18 1629

## 2018-01-16 NOTE — ED Triage Notes (Signed)
Patient complains of body aches, fever, chills, cough, with vomiting on Saturday. Patient states that symptoms started on Saturday.

## 2018-01-16 NOTE — Discharge Instructions (Signed)
Suspect flu.  Medication as prescribed.  Rest, fluids.  Take care  Dr. Adriana Simas

## 2019-06-01 ENCOUNTER — Ambulatory Visit: Admission: EM | Admit: 2019-06-01 | Discharge: 2019-06-01 | Payer: BC Managed Care – PPO

## 2019-06-01 ENCOUNTER — Emergency Department
Admission: EM | Admit: 2019-06-01 | Discharge: 2019-06-01 | Disposition: A | Discharge status: Home / Self Care | Payer: BC Managed Care – PPO | Attending: Emergency Medicine | Admitting: Emergency Medicine

## 2019-06-01 ENCOUNTER — Other Ambulatory Visit: Payer: Self-pay

## 2019-06-01 ENCOUNTER — Emergency Department: Payer: BC Managed Care – PPO

## 2019-06-01 ENCOUNTER — Encounter: Payer: Self-pay | Admitting: Emergency Medicine

## 2019-06-01 DIAGNOSIS — H81391 Other peripheral vertigo, right ear: Secondary | ICD-10-CM | POA: Insufficient documentation

## 2019-06-01 DIAGNOSIS — R030 Elevated blood-pressure reading, without diagnosis of hypertension: Secondary | ICD-10-CM | POA: Diagnosis not present

## 2019-06-01 DIAGNOSIS — R42 Dizziness and giddiness: Secondary | ICD-10-CM

## 2019-06-01 DIAGNOSIS — Z79899 Other long term (current) drug therapy: Secondary | ICD-10-CM | POA: Insufficient documentation

## 2019-06-01 LAB — CBC WITH DIFFERENTIAL/PLATELET
Abs Immature Granulocytes: 0.01 10*3/uL (ref 0.00–0.07)
Basophils Absolute: 0 10*3/uL (ref 0.0–0.1)
Basophils Relative: 1 %
Eosinophils Absolute: 0 10*3/uL (ref 0.0–0.5)
Eosinophils Relative: 1 %
HCT: 44.8 % (ref 39.0–52.0)
Hemoglobin: 15.8 g/dL (ref 13.0–17.0)
Immature Granulocytes: 0 %
Lymphocytes Relative: 30 %
Lymphs Abs: 1.4 10*3/uL (ref 0.7–4.0)
MCH: 31.9 pg (ref 26.0–34.0)
MCHC: 35.3 g/dL (ref 30.0–36.0)
MCV: 90.3 fL (ref 80.0–100.0)
Monocytes Absolute: 0.4 10*3/uL (ref 0.1–1.0)
Monocytes Relative: 9 %
Neutro Abs: 2.8 10*3/uL (ref 1.7–7.7)
Neutrophils Relative %: 59 %
Platelets: 184 10*3/uL (ref 150–400)
RBC: 4.96 MIL/uL (ref 4.22–5.81)
RDW: 11.8 % (ref 11.5–15.5)
WBC: 4.7 10*3/uL (ref 4.0–10.5)
nRBC: 0 % (ref 0.0–0.2)

## 2019-06-01 LAB — COMPREHENSIVE METABOLIC PANEL
ALT: 38 U/L (ref 0–44)
AST: 29 U/L (ref 15–41)
Albumin: 4.5 g/dL (ref 3.5–5.0)
Alkaline Phosphatase: 55 U/L (ref 38–126)
Anion gap: 10 (ref 5–15)
BUN: 17 mg/dL (ref 6–20)
CO2: 24 mmol/L (ref 22–32)
Calcium: 9.5 mg/dL (ref 8.9–10.3)
Chloride: 108 mmol/L (ref 98–111)
Creatinine, Ser: 0.92 mg/dL (ref 0.61–1.24)
GFR calc Af Amer: >60 mL/min (ref >60)
GFR calc non Af Amer: >60 mL/min (ref >60)
Glucose, Bld: 104 mg/dL — ABNORMAL HIGH (ref 70–99)
Potassium: 3.9 mmol/L (ref 3.5–5.1)
Sodium: 142 mmol/L (ref 135–145)
Total Bilirubin: 0.9 mg/dL (ref 0.3–1.2)
Total Protein: 7.8 g/dL (ref 6.5–8.1)

## 2019-06-01 LAB — GLUCOSE, CAPILLARY: Glucose-Capillary: 98 mg/dL (ref 70–99)

## 2019-06-01 MED ORDER — MECLIZINE HCL 25 MG PO TABS: 25.0000 mg | ORAL_TABLET | Freq: Three times a day (TID) | ORAL | 0 refills | Status: AC | PRN

## 2019-06-01 MED ORDER — DIAZEPAM 5 MG PO TABS: 5.0000 mg | ORAL_TABLET | Freq: Three times a day (TID) | ORAL | 0 refills | Status: AC | PRN

## 2019-06-01 MED ORDER — FLUTICASONE PROPIONATE 50 MCG/ACT NA SUSP: 1.0000 | Freq: Every day | NASAL | 1 refills | Status: DC

## 2019-06-01 MED ORDER — DIAZEPAM 5 MG PO TABS
5.0000 mg | ORAL_TABLET | Freq: Once | ORAL | Status: AC
  Administered 2019-06-01: 5 mg via ORAL
  Filled 2019-06-01: qty 1

## 2019-06-01 NOTE — Discharge Instructions (Addendum)
Try the Epley maneuvers that we discussed in the emergency department.  I provided a sheet on how to try these.  Start the Flonase daily to help with drainage.  You can take this over-the-counter, or as prescribed.  For mild dizziness, take the Meclizine. If you have a severe episode, take the Valium and rest.

## 2019-06-01 NOTE — ED Provider Notes (Signed)
Alexander Walters    CSN: [id number] Arrival date & time: 06/01/19  1104      History   Chief Complaint Chief Complaint  Patient presents with  . Dizziness    HPI Alexander Walters is a 44 y.o. male.   Patient presents with sudden onset of "vertigo" while at work this morning.  He states this has been a constant sensation of the disequilibrium.  He has experienced brief episodes of vertigo in the past but none recently and none that lasted more than a few seconds.  He denies focal weakness, slurred speech, asymmetry, chest pain, shortness of breath, fever, chills, or other symptoms.  No treatments attempted.  The history is provided by the patient.    Past Medical History:  Diagnosis Date  . Herniated cervical disc   . Kidney stones     There are no problems to display for this patient.   Past Surgical History:  Procedure Laterality Date  . APPENDECTOMY    . CHOLECYSTECTOMY    . KNEE SURGERY    . TONSILLECTOMY         Home Medications    Prior to Admission medications   Medication Sig Start Date End Date Taking? Authorizing Provider  cetirizine (ZYRTEC) 10 MG chewable tablet Chew 10 mg by mouth daily.    [provider]  ibuprofen (ADVIL,MOTRIN) 800 MG tablet Take 1 tablet (800 mg total) by mouth every 8 (eight) hours as needed for moderate pain. 01/05/15   Paulette Blanch, MD  oseltamivir (TAMIFLU) 75 MG capsule Take 1 capsule (75 mg total) by mouth every 12 (twelve) hours. 01/16/18   Coral Spikes, DO    Family History Family History  Problem Relation Age of Onset  . Heart disease Father     Social History Social History   Tobacco Use  . Smoking status: Never Smoker  . Smokeless tobacco: Current User    Types: Chew  Substance Use Topics  . Alcohol use: Yes    Comment: occasionally once a week  . Drug use: No     Allergies   Patient has no known allergies.   Review of Systems Review of Systems  Constitutional: Negative for chills and  fever.  HENT: Negative for congestion, ear pain, rhinorrhea and sore throat.   Eyes: Negative for pain and visual disturbance.  Respiratory: Negative for cough and shortness of breath.   Cardiovascular: Negative for chest pain and palpitations.  Gastrointestinal: Negative for abdominal pain and vomiting.  Genitourinary: Negative for dysuria and hematuria.  Musculoskeletal: Negative for arthralgias and back pain.  Skin: Negative for color change and rash.  Neurological: Positive for dizziness. Negative for seizures, syncope, facial asymmetry, speech difficulty, weakness, numbness and headaches.  All other systems reviewed and are negative.    Physical Exam Triage Vital Signs ED Triage Vitals  Enc Vitals Group     BP      Pulse      Resp      Temp      Temp src      SpO2      Weight      Height      Head Circumference      Peak Flow      Pain Score      Pain Loc      Pain Edu?      Excl. in [place]?    No data found.  Updated Vital Signs BP (!) 165/99 (BP Location: Left  Arm)   Pulse 65   Temp 98.2 F (36.8 C) (Oral)   Resp 17   SpO2 94%   Visual Acuity Right Eye Distance:   Left Eye Distance:   Bilateral Distance:    Right Eye Near:   Left Eye Near:    Bilateral Near:     Physical Exam Vitals and nursing note reviewed.  Constitutional:      Appearance: He is well-developed.  HENT:     Head: Normocephalic and atraumatic.     Right Ear: Tympanic membrane normal.     Left Ear: Tympanic membrane normal.     Mouth/Throat:     Mouth: Mucous membranes are moist.     Pharynx: Oropharynx is clear.  Eyes:     Conjunctiva/sclera: Conjunctivae normal.  Cardiovascular:     Rate and Rhythm: Normal rate and regular rhythm.     Heart sounds: No murmur.  Pulmonary:     Effort: Pulmonary effort is normal. No respiratory distress.     Breath sounds: Normal breath sounds.  Abdominal:     Palpations: Abdomen is soft.     Tenderness: There is no abdominal tenderness. There  is no guarding or rebound.  Musculoskeletal:     Cervical back: Neck supple.  Skin:    General: Skin is warm and dry.  Neurological:     General: No focal deficit present.     Mental Status: He is alert and oriented to person, place, and time.     Cranial Nerves: No cranial nerve deficit.     Sensory: No sensory deficit.     Motor: No weakness.     Coordination: Romberg sign positive.     Gait: Gait normal.  Psychiatric:        Mood and Affect: Mood normal.        Behavior: Behavior normal.      UC Treatments / Results  Labs (all labs ordered are listed, but only abnormal results are displayed) Labs Reviewed - No data to display  EKG   Radiology No results found.  Procedures Procedures (including critical care time)  Medications Ordered in UC Medications - No data to display  Initial Impression / Assessment and Plan / UC Course  I have reviewed the triage vital signs and the nursing notes.  Pertinent labs & imaging results that were available during my care of the patient were reviewed by me and considered in my medical decision making (see chart for details).   Dizziness. Elevated blood pressure.  Romberg positive.  Sending patient to the ED for evaluation.  Patient declines EMS; his coworker drove him here and will drive him to the ED.      Final Clinical Impressions(s) / UC Diagnoses   Final diagnoses:  Dizziness  Elevated blood pressure reading     Discharge Instructions     Go to the emergency department for evaluation of your acute dizziness and abnormal exam.    Your blood pressure is elevated today at 165/99.  Please have this rechecked by your primary care provider in 2-4 weeks.         ED Prescriptions    None     PDMP not reviewed this encounter.   Mickie Bail, NP 06/01/19 1135

## 2019-06-01 NOTE — ED Notes (Signed)
Patient placed back in lobby from CT

## 2019-06-01 NOTE — ED Triage Notes (Signed)
Pt presents with vertigo since waking up this morning; pt states sometimes he experiences with muscle tension in his neck and it goes away.

## 2019-06-01 NOTE — ED Triage Notes (Signed)
Pt presents to ED via POV, pt states started having dizziness at approx 0915 this AM. Pt also c/o HTN, pt denies hx of HTN. Pt denies further symptoms such as weakness/numbness/tingling. Pt A&O x4, no focal deficits, facial symmetry intact.

## 2019-06-01 NOTE — ED Notes (Signed)
This RN spoke with Dr. Roxan Hockey regarding patient's care, VORB for blood work and head CT.

## 2019-06-01 NOTE — Discharge Instructions (Addendum)
Go to the emergency department for evaluation of your acute dizziness and abnormal exam.    Your blood pressure is elevated today at 165/99.  Please have this rechecked by your primary care provider in 2-4 weeks.

## 2019-06-01 NOTE — ED Provider Notes (Signed)
[facility] Emergency Department Provider Note  ____________________________________________   First MD Initiated Contact with Patient 06/01/19 1413     (approximate)  I have reviewed the triage vital signs and the nursing notes.   HISTORY  Chief Complaint Dizziness    HPI Alexander Walters is a 44 y.o. male  Here with dizziness, sensation of dysequilibrium. Symptoms began acutely at work this morning. He reports he bent down at work to get something under a machine, lifted up then experienced acute onset of motion sickness and dizziness. He felt like he was walking sideways and lost his balance. He had associated nausea, but no vomiting. No associated vision changes, dysarthria, dysphagia, or other symptoms. He went to [facility] who sent him here for evaluation. Of note, pt has h/o allergies and some chronic R>L ear pressure. H/o similar episodes though transient in nature. No fever, chills. No other complaints. Sx gradually improving here in ED, though worse when turning head and sitting up from CT.        Past Medical History:  Diagnosis Date  . Herniated cervical disc   . Kidney stones     There are no problems to display for this patient.   Past Surgical History:  Procedure Laterality Date  . APPENDECTOMY    . CHOLECYSTECTOMY    . KNEE SURGERY    . TONSILLECTOMY      Prior to Admission medications   Medication Sig Start Date End Date Taking? Authorizing Provider  cetirizine (ZYRTEC) 10 MG chewable tablet Chew 10 mg by mouth daily.    [provider]  diazepam (VALIUM) 5 MG tablet Take 1 tablet (5 mg total) by mouth every 8 (eight) hours as needed (vertigo). 06/01/19 05/31/20  Shaune Pollack, MD  fluticasone (FLONASE) 50 MCG/ACT nasal spray Place 1 spray into both nostrils daily. 06/01/19 07/01/19  Shaune Pollack, MD  ibuprofen (ADVIL,MOTRIN) 800 MG tablet Take 1 tablet (800 mg total) by mouth every 8 (eight) hours as needed for moderate pain.  01/05/15   Irean Hong, MD  meclizine (ANTIVERT) 25 MG tablet Take 1 tablet (25 mg total) by mouth 3 (three) times daily as needed for dizziness (mild dizziness). 06/01/19   Shaune Pollack, MD  oseltamivir (TAMIFLU) 75 MG capsule Take 1 capsule (75 mg total) by mouth every 12 (twelve) hours. 01/16/18   Tommie Sams, DO    Allergies Patient has no known allergies.  Family History  Problem Relation Age of Onset  . Heart disease Father     Social History Social History   Tobacco Use  . Smoking status: Never Smoker  . Smokeless tobacco: Current User    Types: Chew  Substance Use Topics  . Alcohol use: Yes    Comment: occasionally once a week  . Drug use: No    Review of Systems  Review of Systems  Constitutional: Negative for chills, fatigue and fever.  HENT: Negative for sore throat.   Respiratory: Negative for shortness of breath.   Cardiovascular: Negative for chest pain.  Gastrointestinal: Positive for nausea. Negative for abdominal pain.  Genitourinary: Negative for flank pain.  Musculoskeletal: Positive for gait problem. Negative for neck pain.  Skin: Negative for rash and wound.  Allergic/Immunologic: Negative for immunocompromised state.  Neurological: Positive for dizziness. Negative for weakness and numbness.  Hematological: Does not bruise/bleed easily.  All other systems reviewed and are negative.    ____________________________________________  PHYSICAL EXAM:      VITAL SIGNS: ED Triage  Vitals [06/01/19 1153]  Enc Vitals Group     BP (!) 161/101     Pulse Rate 75     Resp 18     Temp 98.1 F (36.7 C)     Temp Source Oral     SpO2 96 %     Weight 230 lb (104.3 kg)     Height 5\' 6"  (1.676 m)     Head Circumference      Peak Flow      Pain Score 0     Pain Loc      Pain Edu?      Excl. in [place]?      Physical Exam Vitals and nursing note reviewed.  Constitutional:      General: He is not in acute distress.    Appearance: He is well-developed.    HENT:     Head: Normocephalic and atraumatic.     Comments: Moderate nasal mucosal edema, no obstruction. Right TM with serous effusion and bulging, no erythema. No mastoid tenderness, erythema, or swelling. Left TM normal. Eyes:     Conjunctiva/sclera: Conjunctivae normal.  Cardiovascular:     Rate and Rhythm: Normal rate and regular rhythm.     Heart sounds: Normal heart sounds.  Pulmonary:     Effort: Pulmonary effort is normal. No respiratory distress.     Breath sounds: No wheezing.  Abdominal:     General: There is no distension.  Musculoskeletal:     Cervical back: Neck supple.  Skin:    General: Skin is warm.     Capillary Refill: Capillary refill takes less than 2 seconds.     Findings: No rash.  Neurological:     Mental Status: He is alert and oriented to person, place, and time.     Motor: No abnormal muscle tone.     Comments: Neurological Exam:  Mental Status: Alert and oriented to person, place, and time. Attention and concentration normal. Speech clear. Recent memory is intact. Cranial Nerves: Visual fields grossly intact. EOMI and PERRLA. No nystagmus noted at rest. Unidirectional, horizontal, right ward beating nystagmus reproduced with Dix-Hallpike, which fatigues with time and has mild latency in onset. Facial sensation intact at forehead, maxillary cheek, and chin/mandible bilaterally. No facial asymmetry or weakness. Hearing grossly normal. Uvula is midline, and palate elevates symmetrically. Normal SCM and trapezius strength. Tongue midline without fasciculations. Motor: Muscle strength 5/5 in proximal and distal UE and LE bilaterally. No pronator drift. Muscle tone normal. Sensation: Intact to light touch in upper and lower extremities distally bilaterally.  Gait: Normal without ataxia. Coordination: Normal FTN bilaterally.          ____________________________________________   LABS (all labs ordered are listed, but only abnormal results are  displayed)  Labs Reviewed  COMPREHENSIVE METABOLIC PANEL - Abnormal; Notable for the following components:      Result Value   Glucose, Bld 104 (*)    All other components within normal limits  CBC WITH DIFFERENTIAL/PLATELET  GLUCOSE, CAPILLARY    ____________________________________________  EKG: Normal sinus rhythm, ventricular rate 70.  PR 150, QRS 110, QTc 416.  No acute ST elevations or depressions. ________________________________________  RADIOLOGY All imaging, including plain films, CT scans, and ultrasounds, independently reviewed by me, and interpretations confirmed via formal radiology reads.  ED MD interpretation:   CT head: No acute abnormality  Official radiology report(s): CT Head Wo Contrast  Result Date: 06/01/2019 CLINICAL DATA:  Dizziness EXAM: CT HEAD WITHOUT CONTRAST TECHNIQUE: Contiguous axial  images were obtained from the base of the skull through the vertex without intravenous contrast. COMPARISON:  None. FINDINGS: Brain: No evidence of acute infarction, hemorrhage, hydrocephalus, extra-axial collection or mass lesion/mass effect. Vascular: No hyperdense vessel or unexpected calcification. Skull: Normal. Negative for fracture or focal lesion. Sinuses/Orbits: The visualized paranasal sinuses are essentially clear. The mastoid air cells are unopacified. Other: None. IMPRESSION: Normal head CT. Electronically Signed   By: Charline Bills M.D.   On: 06/01/2019 12:31    ____________________________________________  PROCEDURES   Procedure(s) performed (including Critical Care):  Procedures  ____________________________________________  INITIAL IMPRESSION / MDM / ASSESSMENT AND PLAN / ED COURSE  As part of my medical decision making, I reviewed the following data within the electronic MEDICAL RECORD NUMBER Nursing notes reviewed and incorporated, Old chart reviewed, Notes from prior ED visits, and [place] Controlled Substance Database       *MACKENZY EISENBERG was  evaluated in Emergency Department on 06/01/2019 for the symptoms described in the history of present illness. He was evaluated in the context of the global COVID-19 pandemic, which necessitated consideration that the patient might be at risk for infection with the SARS-CoV-2 virus that causes COVID-19. Institutional protocols and algorithms that pertain to the evaluation of patients at risk for COVID-19 are in a state of rapid change based on information released by regulatory bodies including the CDC and federal and state organizations. These policies and algorithms were followed during the patient's care in the ED.  Some ED evaluations and interventions may be delayed as a result of limited staffing during the pandemic.*     Medical Decision Making: Pleasant 44 year old male here with suspected peripheral vertigo.  Patient has reproducible, latent onset, easily fatigued horizontal nystagmus with Epley maneuvers, and history of similar episodes, with no significant risk factors for early atherosclerotic disease, making peripheral etiology more likely.  He has right serous otitis on exam, which I suspect is secondary to his allergies and is contributing.  He has absolutely no dysarthria, dysphagia, or focal neurological deficits on exam, including normal Romberg currently, despite reported positive an urgent care, which again is consistent with a resolving peripheral source.  He has no skew.  Nystagmus is horizontal only.  I had a long discussion with patient regarding his diagnosis and management.  Will treat with benzos as needed, meclizine, and advised him to start a nasal steroid spray given his allergies and serous effusions.  Will refer to ENT as needed.  Return precautions given.  Otherwise, the patient had been activated initially as a stroke work-up, and has a negative CT which is even more reassuring, and showed no evidence of mass or lesion to explain his  symptoms.  ____________________________________________  FINAL CLINICAL IMPRESSION(S) / ED DIAGNOSES  Final diagnoses:  Peripheral vertigo involving right ear     MEDICATIONS GIVEN DURING THIS VISIT:  Medications  diazepam (VALIUM) tablet 5 mg (5 mg Oral Given 06/01/19 1502)     ED Discharge Orders         Ordered    fluticasone (FLONASE) 50 MCG/ACT nasal spray  Daily     06/01/19 1527    diazepam (VALIUM) 5 MG tablet  Every 8 hours PRN     06/01/19 1527    meclizine (ANTIVERT) 25 MG tablet  3 times daily PRN     06/01/19 1527           Note:  This document was prepared using Dragon voice recognition software and may include unintentional  dictation errors.   Shaune Pollack, MD 06/01/19 1534

## 2019-06-01 NOTE — ED Notes (Signed)
Patient is being discharged from the Urgent Care Center and sent to the Emergency Department via personal vehicle by family member. Per provider Wendee Beavers, patient is stable but in need of higher level of care due to dizziness of unknown source. Patient is aware and verbalizes understanding of plan of care.   Vitals:   06/01/19 1114  BP: (!) 165/99  Pulse: 65  Resp: 17  Temp: 98.2 F (36.8 C)  SpO2: 94%

## 2021-06-11 DIAGNOSIS — I10 Essential (primary) hypertension: Secondary | ICD-10-CM | POA: Insufficient documentation

## 2022-09-10 ENCOUNTER — Encounter: Payer: Self-pay | Admitting: Gastroenterology

## 2022-09-16 NOTE — H&P (Signed)
Pre-Procedure H&P   Patient ID: Alexander Walters is a 47 y.o. male.  Gastroenterology Provider: Jaynie Collins, DO  Referring Provider: Tawni Pummel, PA PCP: [facility]  Date: 09/17/2022  HPI Alexander Walters is a 47 y.o. male who presents today for Colonoscopy for Colorectal cancer screening .  Patient has noted increased abdominal bloating.  He reports regular bowel movements without melena or hematochezia.  He presently goes 2 times a day.  He takes iron every other day for mildly depressed iron studies  Status post appendectomy and cholecystectomy  Last underwent colonoscopy in April 2014 demonstrating 2 hyperplastic polyps and hemorrhoids.  Normal TI was appreciated  Hemoglobin 14.3 MCV 89 platelets 212,000 ferritin 15 TIBC 428 iron saturation 14% creatinine 0.8  No family history of colon cancer or colon polyps   Past Medical History:  Diagnosis Date   Cervicalgia    Chiari I malformation (HCC)    DDD (degenerative disc disease), cervical    Depression    GERD (gastroesophageal reflux disease)    Hematochezia    Hemorrhoids    Herniated cervical disc    History of kidney stones    Hypertension    Kidney stones    Microhematuria    Nephrolithiasis    Numbness and tingling in both hands    PONV (postoperative nausea and vomiting)    Sleep apnea     Past Surgical History:  Procedure Laterality Date   APPENDECTOMY     CHOLECYSTECTOMY     COLONOSCOPY     COMPLEX ACETABULAR FRACTURE     FRACTURE SURGERY     KNEE SURGERY     LITHOTRIPSY     MUSCLE BIOPSY     SPINE SURGERY     SUBOCCIPITAL CRANIECTOMY CERVICAL LAMINECTOMY     TONSILLECTOMY      Family History No h/o GI disease or malignancy  Review of Systems  Constitutional:  Negative for activity change, appetite change, chills, diaphoresis, fatigue, fever and unexpected weight change.  HENT:  Negative for trouble swallowing and voice change.   Respiratory:  Negative for  shortness of breath and wheezing.   Cardiovascular:  Negative for chest pain, palpitations and leg swelling.  Gastrointestinal:  Negative for abdominal distention, abdominal pain, anal bleeding, blood in stool, constipation, diarrhea, nausea and vomiting.  Musculoskeletal:  Negative for arthralgias and myalgias.  Skin:  Negative for color change and pallor.  Neurological:  Negative for dizziness, syncope and weakness.  Psychiatric/Behavioral:  Negative for confusion. The patient is not nervous/anxious.   All other systems reviewed and are negative.    Medications No current facility-administered medications on file prior to encounter.   Current Outpatient Medications on File Prior to Encounter  Medication Sig Dispense Refill   cetirizine (ZYRTEC) 10 MG chewable tablet Chew 10 mg by mouth daily.     clomiPHENE (CLOMID) 50 MG tablet Take 50 mg by mouth daily.     escitalopram (LEXAPRO) 5 MG tablet Take 5 mg by mouth at bedtime.     ferrous sulfate 324 MG TBEC Take 324 mg by mouth daily with breakfast.     losartan (COZAAR) 25 MG tablet Take 25 mg by mouth daily.     pravastatin (PRAVACHOL) 10 MG tablet Take 20 mg by mouth daily.     fluticasone (FLONASE) 50 MCG/ACT nasal spray Place 1 spray into both nostrils daily. 9.9 mL 1   ibuprofen (ADVIL,MOTRIN) 800 MG tablet Take 1 tablet (800 mg total)  by mouth every 8 (eight) hours as needed for moderate pain. 15 tablet 0   meclizine (ANTIVERT) 25 MG tablet Take 1 tablet (25 mg total) by mouth 3 (three) times daily as needed for dizziness (mild dizziness). 30 tablet 0   oseltamivir (TAMIFLU) 75 MG capsule Take 1 capsule (75 mg total) by mouth every 12 (twelve) hours. 10 capsule 0    Pertinent medications related to GI and procedure were reviewed by me with the patient prior to the procedure   Current Facility-Administered Medications:    0.9 %  sodium chloride infusion, , Intravenous, Continuous, Jaynie Collins, DO, Last Rate: 20 mL/hr at  09/17/22 1008, New Bag at 09/17/22 1008  sodium chloride 20 mL/hr at 09/17/22 1008       No Known Allergies Allergies were reviewed by me prior to the procedure  Objective   Body mass index is 42.93 kg/m. Vitals:   09/17/22 0953  BP: (!) 145/103  Pulse: 85  Resp: 19  Temp: (!) 96.2 F (35.7 C)  TempSrc: Temporal  SpO2: 99%  Weight: 120.7 kg  Height: 5\' 6"  (1.676 m)     Physical Exam Vitals and nursing note reviewed.  Constitutional:      General: He is not in acute distress.    Appearance: Normal appearance. He is obese. He is not ill-appearing, toxic-appearing or diaphoretic.  HENT:     Head: Normocephalic and atraumatic.     Nose: Nose normal.     Mouth/Throat:     Mouth: Mucous membranes are moist.     Pharynx: Oropharynx is clear.  Eyes:     General: No scleral icterus.    Extraocular Movements: Extraocular movements intact.  Cardiovascular:     Rate and Rhythm: Normal rate and regular rhythm.     Heart sounds: Normal heart sounds. No murmur heard.    No friction rub. No gallop.  Pulmonary:     Effort: Pulmonary effort is normal. No respiratory distress.     Breath sounds: Normal breath sounds. No wheezing, rhonchi or rales.  Abdominal:     General: Bowel sounds are normal. There is no distension.     Palpations: Abdomen is soft.     Tenderness: There is no abdominal tenderness. There is no guarding or rebound.  Musculoskeletal:     Cervical back: Neck supple.     Right lower leg: No edema.     Left lower leg: No edema.  Skin:    General: Skin is warm and dry.     Coloration: Skin is not jaundiced or pale.  Neurological:     General: No focal deficit present.     Mental Status: He is alert and oriented to person, place, and time. Mental status is at baseline.  Psychiatric:        Mood and Affect: Mood normal.        Behavior: Behavior normal.        Thought Content: Thought content normal.        Judgment: Judgment normal.      Assessment:   Alexander Walters is a 47 y.o. male  who presents today for Colonoscopy for Colorectal cancer screening .  Plan:  Colonoscopy with possible intervention today  Colonoscopy with possible biopsy, control of bleeding, polypectomy, and interventions as necessary has been discussed with the patient/patient representative. Informed consent was obtained from the patient/patient representative after explaining the indication, nature, and risks of the procedure including but not limited to death,  bleeding, perforation, missed neoplasm/lesions, cardiorespiratory compromise, and reaction to medications. Opportunity for questions was given and appropriate answers were provided. Patient/patient representative has verbalized understanding is amenable to undergoing the procedure.   Jaynie Collins, DO  [facility]  Portions of the record may have been created with voice recognition software. Occasional wrong-word or 'sound-a-like' substitutions may have occurred due to the inherent limitations of voice recognition software.  Read the chart carefully and recognize, using context, where substitutions may have occurred.

## 2022-09-17 ENCOUNTER — Other Ambulatory Visit: Payer: Self-pay

## 2022-09-17 ENCOUNTER — Encounter: Payer: Self-pay | Admitting: Gastroenterology

## 2022-09-17 ENCOUNTER — Encounter
Admission: RE | Disposition: A | Discharge status: Home / Self Care | Payer: Self-pay | Source: Home / Self Care | Attending: Gastroenterology

## 2022-09-17 ENCOUNTER — Ambulatory Visit: Payer: BC Managed Care – PPO | Admitting: Anesthesiology

## 2022-09-17 ENCOUNTER — Ambulatory Visit
Admission: RE | Admit: 2022-09-17 | Discharge: 2022-09-17 | Disposition: A | Discharge status: Home / Self Care | Payer: BC Managed Care – PPO | Attending: Gastroenterology | Admitting: Gastroenterology

## 2022-09-17 DIAGNOSIS — Z1211 Encounter for screening for malignant neoplasm of colon: Secondary | ICD-10-CM | POA: Diagnosis present

## 2022-09-17 DIAGNOSIS — D124 Benign neoplasm of descending colon: Secondary | ICD-10-CM | POA: Diagnosis not present

## 2022-09-17 DIAGNOSIS — Z9049 Acquired absence of other specified parts of digestive tract: Secondary | ICD-10-CM | POA: Diagnosis not present

## 2022-09-17 DIAGNOSIS — K621 Rectal polyp: Secondary | ICD-10-CM | POA: Diagnosis not present

## 2022-09-17 DIAGNOSIS — G4730 Sleep apnea, unspecified: Secondary | ICD-10-CM | POA: Diagnosis not present

## 2022-09-17 DIAGNOSIS — I10 Essential (primary) hypertension: Secondary | ICD-10-CM | POA: Insufficient documentation

## 2022-09-17 DIAGNOSIS — K5730 Diverticulosis of large intestine without perforation or abscess without bleeding: Secondary | ICD-10-CM | POA: Diagnosis not present

## 2022-09-17 DIAGNOSIS — K640 First degree hemorrhoids: Secondary | ICD-10-CM | POA: Diagnosis not present

## 2022-09-17 DIAGNOSIS — F32A Depression, unspecified: Secondary | ICD-10-CM | POA: Diagnosis not present

## 2022-09-17 HISTORY — DX: Gastro-esophageal reflux disease without esophagitis: K21.9

## 2022-09-17 HISTORY — DX: Anesthesia of skin: R20.2

## 2022-09-17 HISTORY — DX: Melena: K92.1

## 2022-09-17 HISTORY — DX: Essential (primary) hypertension: I10

## 2022-09-17 HISTORY — DX: Depression, unspecified: F32.A

## 2022-09-17 HISTORY — DX: Unspecified hemorrhoids: K64.9

## 2022-09-17 HISTORY — DX: Other cervical disc degeneration, unspecified cervical region: M50.30

## 2022-09-17 HISTORY — DX: Nausea with vomiting, unspecified: Z98.890

## 2022-09-17 HISTORY — DX: Other microscopic hematuria: R31.29

## 2022-09-17 HISTORY — DX: Sleep apnea, unspecified: G47.30

## 2022-09-17 HISTORY — DX: Anesthesia of skin: R20.0

## 2022-09-17 HISTORY — DX: Personal history of urinary calculi: Z87.442

## 2022-09-17 HISTORY — DX: Cervicalgia: M54.2

## 2022-09-17 HISTORY — DX: Calculus of kidney: N20.0

## 2022-09-17 HISTORY — DX: Compression of brain: G93.5

## 2022-09-17 HISTORY — PX: COLONOSCOPY WITH PROPOFOL: SHX5780

## 2022-09-17 SURGERY — COLONOSCOPY WITH PROPOFOL
Anesthesia: General

## 2022-09-17 MED ORDER — SODIUM CHLORIDE 0.9 % IV SOLN: INTRAVENOUS | Status: DC

## 2022-09-17 MED ORDER — PROPOFOL 500 MG/50ML IV EMUL
INTRAVENOUS | Status: DC | PRN
  Administered 2022-09-17: 165 ug/kg/min via INTRAVENOUS

## 2022-09-17 MED ORDER — LIDOCAINE HCL (CARDIAC) PF 100 MG/5ML IV SOSY
PREFILLED_SYRINGE | INTRAVENOUS | Status: DC | PRN
  Administered 2022-09-17: 100 mg via INTRAVENOUS

## 2022-09-17 MED ORDER — PROPOFOL 10 MG/ML IV BOLUS
INTRAVENOUS | Status: DC | PRN
  Administered 2022-09-17: 70 mg via INTRAVENOUS
  Administered 2022-09-17: 30 mg via INTRAVENOUS
  Administered 2022-09-17 (×2): 20 mg via INTRAVENOUS

## 2022-09-17 MED ORDER — STERILE WATER FOR IRRIGATION IR SOLN
Status: DC | PRN
  Administered 2022-09-17: 60 mL

## 2022-09-17 MED ORDER — GLYCOPYRROLATE 0.2 MG/ML IJ SOLN
INTRAMUSCULAR | Status: DC | PRN
  Administered 2022-09-17: .2 mg via INTRAVENOUS

## 2022-09-17 NOTE — Anesthesia Postprocedure Evaluation (Signed)
Anesthesia Post Note  Patient: Alexander Walters  Procedure(s) Performed: COLONOSCOPY WITH PROPOFOL  Patient location during evaluation: PACU Anesthesia Type: General Level of consciousness: awake and alert, oriented and patient cooperative Pain management: pain level controlled Vital Signs Assessment: post-procedure vital signs reviewed and stable Respiratory status: spontaneous breathing, nonlabored ventilation and respiratory function stable Cardiovascular status: blood pressure returned to baseline and stable Postop Assessment: adequate PO intake Anesthetic complications: no   No notable events documented.   Last Vitals:  Vitals:   09/17/22 1120 09/17/22 1134  BP: 106/76 102/75  Pulse: 82 72  Resp: 19 17  Temp:    SpO2: 99% 96%    Last Pain:  Vitals:   09/17/22 0953  TempSrc: Temporal  PainSc: 0-No pain                 Reed Breech

## 2022-09-17 NOTE — Anesthesia Preprocedure Evaluation (Addendum)
Anesthesia Evaluation  Patient identified by MRN, date of birth, ID band Patient awake    Reviewed: Allergy & Precautions, NPO status , Patient's Chart, lab work & pertinent test results  History of Anesthesia Complications Negative for: history of anesthetic complications  Airway Mallampati: III   Neck ROM: Full    Dental   Crowns :   Pulmonary sleep apnea    Pulmonary exam normal breath sounds clear to auscultation       Cardiovascular hypertension, Normal cardiovascular exam Rhythm:Regular Rate:Normal     Neuro/Psych  PSYCHIATRIC DISORDERS  Depression    Chiari I malformation    GI/Hepatic ,GERD  ,,  Endo/Other  Class 3 obesity   Renal/GU Renal disease (nephrolithiasis)     Musculoskeletal   Abdominal   Peds  Hematology negative hematology ROS (+)   Anesthesia Other Findings   Reproductive/Obstetrics                             Anesthesia Physical Anesthesia Plan  ASA: 3  Anesthesia Plan: General   Post-op Pain Management:    Induction: Intravenous  PONV Risk Score and Plan: 3 and Propofol infusion, TIVA and Treatment may vary due to age or medical condition  Airway Management Planned: Natural Airway  Additional Equipment:   Intra-op Plan:   Post-operative Plan:   Informed Consent: I have reviewed the patients History and Physical, chart, labs and discussed the procedure including the risks, benefits and alternatives for the proposed anesthesia with the patient or authorized representative who has indicated his/her understanding and acceptance.       Plan Discussed with: CRNA  Anesthesia Plan Comments: (LMA/GETA backup discussed.  Patient consented for risks of anesthesia including but not limited to:  - adverse reactions to medications - damage to eyes, teeth, lips or other oral mucosa - nerve damage due to positioning  - sore throat or hoarseness - damage to  heart, brain, nerves, lungs, other parts of body or loss of life  Informed patient about role of CRNA in peri- and intra-operative care.  Patient voiced understanding.)        Anesthesia Quick Evaluation

## 2022-09-17 NOTE — Interval H&P Note (Signed)
History and Physical Interval Note: Preprocedure H&P from 09/17/22  was reviewed and there was no interval change after seeing and examining the patient.  Written consent was obtained from the patient after discussion of risks, benefits, and alternatives. Patient has consented to proceed with Colonoscopy with possible intervention   09/17/2022 10:53 AM  Alexander Walters  has presented today for surgery, with the diagnosis of V76.51 (ICD-9-CM) - Z12.11 (ICD-10-CM) - Colon cancer screening.  The various methods of treatment have been discussed with the patient and family. After consideration of risks, benefits and other options for treatment, the patient has consented to  Procedure(s): COLONOSCOPY WITH PROPOFOL (N/A) as a surgical intervention.  The patient's history has been reviewed, patient examined, no change in status, stable for surgery.  I have reviewed the patient's chart and labs.  Questions were answered to the patient's satisfaction.     Jaynie Collins

## 2022-09-17 NOTE — Anesthesia Procedure Notes (Signed)
Procedure Name: General with mask airway Date/Time: 09/17/2022 10:59 AM  Performed by: Mohammed Kindle, CRNAPre-anesthesia Checklist: Patient identified Patient Re-evaluated:Patient Re-evaluated prior to induction Oxygen Delivery Method: Simple face mask Induction Type: IV induction Placement Confirmation: positive ETCO2, CO2 detector and breath sounds checked- equal and bilateral Dental Injury: Teeth and Oropharynx as per pre-operative assessment

## 2022-09-17 NOTE — Op Note (Signed)
[facility] Patient Name: Alexander Walters Procedure Date: 09/17/2022 10:51 AM MRN: [id number] Account #: [id number] Date of Birth: [birth year] Admit Type: Outpatient Age: 47 Room: [facility] ENDO ROOM 1 Gender: Male Note Status: Finalized Instrument Name: Colonoscope [id number] Procedure:             Colonoscopy Indications:           Screening for colorectal malignant neoplasm Providers:             Jaynie Collins DO, DO Medicines:             Monitored Anesthesia Care Complications:         No immediate complications. Estimated blood loss:                         Minimal. Procedure:             Pre-Anesthesia Assessment:                        - Prior to the procedure, a History and Physical was                         performed, and patient medications and allergies were                         reviewed. The patient is competent. The risks and                         benefits of the procedure and the sedation options and                         risks were discussed with the patient. All questions                         were answered and informed consent was obtained.                         Patient identification and proposed procedure were                         verified by the physician, the nurse, the anesthetist                         and the technician in the endoscopy suite. Mental                         Status Examination: alert and oriented. Airway                         Examination: normal oropharyngeal airway and neck                         mobility. Respiratory Examination: clear to                         auscultation. CV Examination: RRR, no murmurs, no S3                         or S4. Prophylactic Antibiotics: The patient does not  require prophylactic antibiotics. Prior                         Anticoagulants: The patient has taken no anticoagulant                         or antiplatelet agents. ASA Grade  Assessment: III - A                         patient with severe systemic disease. After reviewing                         the risks and benefits, the patient was deemed in                         satisfactory condition to undergo the procedure. The                         anesthesia plan was to use monitored anesthesia care                         (MAC). Immediately prior to administration of                         medications, the patient was re-assessed for adequacy                         to receive sedatives. The heart rate, respiratory                         rate, oxygen saturations, blood pressure, adequacy of                         pulmonary ventilation, and response to care were                         monitored throughout the procedure. The physical                         status of the patient was re-assessed after the                         procedure.                        After obtaining informed consent, the colonoscope was                         passed under direct vision. Throughout the procedure,                         the patient's blood pressure, pulse, and oxygen                         saturations were monitored continuously. The                         Colonoscope was introduced through the anus and  advanced to the the terminal ileum, with                         identification of the appendiceal orifice and IC                         valve. The colonoscopy was performed without                         difficulty. The patient tolerated the procedure well.                         The quality of the bowel preparation was evaluated                         using the BBPS [facility] Bowel Preparation Scale) with                         scores of: Right Colon = 3, Transverse Colon = 3 and                         Left Colon = 3 (entire mucosa seen well with no                         residual staining, small fragments of stool or opaque                          liquid). The total BBPS score equals 9. The terminal                         ileum, ileocecal valve, appendiceal orifice, and                         rectum were photographed. Findings:      The perianal and digital rectal examinations were normal. Pertinent       negatives include normal sphincter tone.      The terminal ileum appeared normal. Estimated blood loss: none.      Retroflexion in the right colon was performed.      Multiple small-mouthed diverticula were found in the sigmoid colon.       Estimated blood loss: none.      Non-bleeding internal hemorrhoids were found during retroflexion. The       hemorrhoids were Grade I (internal hemorrhoids that do not prolapse).       Estimated blood loss: none.      Four sessile polyps were found in the rectum (2) and descending colon       (2). The polyps were 1 to 2 mm in size. These polyps were removed with a       jumbo cold forceps. Resection and retrieval were complete. Estimated       blood loss was minimal.      The exam was otherwise without abnormality on direct and retroflexion       views. Impression:            - The examined portion of the ileum was normal.                        -  Diverticulosis in the sigmoid colon.                        - Non-bleeding internal hemorrhoids.                        - Four 1 to 2 mm polyps in the rectum and in the                         descending colon, removed with a jumbo cold forceps.                         Resected and retrieved.                        - The examination was otherwise normal on direct and                         retroflexion views. Recommendation:        - Patient has a contact number available for                         emergencies. The signs and symptoms of potential                         delayed complications were discussed with the patient.                         Return to normal activities tomorrow. Written                         discharge  instructions were provided to the patient.                        - Discharge patient to home.                        - Resume previous diet.                        - Continue present medications.                        - Await pathology results.                        - Repeat colonoscopy for surveillance based on                         pathology results.                        - Return to referring physician as previously                         scheduled.                        - The findings and recommendations were discussed with  the patient. Procedure Code(s):     --- Professional ---                        [id number], Colonoscopy, flexible; with biopsy, single or                         multiple Diagnosis Code(s):     --- Professional ---                        Z12.11, Encounter for screening for malignant neoplasm                         of colon                        K64.0, First degree hemorrhoids                        D12.8, Benign neoplasm of rectum                        D12.4, Benign neoplasm of descending colon                        K57.30, Diverticulosis of large intestine without                         perforation or abscess without bleeding CPT copyright 2022 American [employer]. All rights reserved. The codes documented in this report are preliminary and upon coder review may  be revised to meet current compliance requirements. Attending Participation:      I personally performed the entire procedure. Elfredia Nevins, DO Jaynie Collins DO, DO 09/17/2022 11:20:25 AM This report has been signed electronically. Number of Addenda: 0 Note Initiated On: 09/17/2022 10:51 AM Scope Withdrawal Time: 0 hours 10 minutes 48 seconds  Total Procedure Duration: 0 hours 12 minutes 49 seconds  Estimated Blood Loss:  Estimated blood loss was minimal.      [facility]

## 2022-09-17 NOTE — Transfer of Care (Signed)
Immediate Anesthesia Transfer of Care Note  Patient: Alexander Walters  Procedure(s) Performed: COLONOSCOPY WITH PROPOFOL  Patient Location: Endoscopy Unit  Anesthesia Type:General  Level of Consciousness: awake, drowsy, and patient cooperative  Airway & Oxygen Therapy: Patient Spontanous Breathing and Patient connected to face mask oxygen  Post-op Assessment: Report given to RN and Post -op Vital signs reviewed and stable  Post vital signs: Reviewed and stable  Last Vitals:  Vitals Value Taken Time  BP 106/76 09/17/22 1120  Temp    Pulse 100 09/17/22 1120  Resp 19 09/17/22 1120  SpO2 95 % 09/17/22 1120  Vitals shown include unvalidated device data.  Last Pain:  Vitals:   09/17/22 0953  TempSrc: Temporal  PainSc: 0-No pain         Complications: No notable events documented.

## 2022-09-20 ENCOUNTER — Encounter: Payer: Self-pay | Admitting: Gastroenterology

## 2022-11-23 ENCOUNTER — Other Ambulatory Visit: Payer: Self-pay | Admitting: Family Medicine

## 2022-11-23 DIAGNOSIS — R300 Dysuria: Secondary | ICD-10-CM

## 2022-11-23 DIAGNOSIS — N400 Enlarged prostate without lower urinary tract symptoms: Secondary | ICD-10-CM

## 2022-11-23 DIAGNOSIS — R350 Frequency of micturition: Secondary | ICD-10-CM

## 2022-12-01 ENCOUNTER — Ambulatory Visit
Admission: RE | Admit: 2022-12-01 | Discharge: 2022-12-01 | Disposition: A | Discharge status: Home / Self Care | Payer: BC Managed Care – PPO | Source: Ambulatory Visit | Attending: Family Medicine | Admitting: Family Medicine

## 2022-12-01 DIAGNOSIS — N400 Enlarged prostate without lower urinary tract symptoms: Secondary | ICD-10-CM

## 2022-12-01 DIAGNOSIS — R300 Dysuria: Secondary | ICD-10-CM

## 2022-12-01 DIAGNOSIS — R350 Frequency of micturition: Secondary | ICD-10-CM

## 2022-12-01 MED ORDER — IOPAMIDOL (ISOVUE-300) INJECTION 61%
200.0000 mL | Freq: Once | INTRAVENOUS | Status: AC | PRN
  Administered 2022-12-01: 100 mL via INTRAVENOUS

## 2023-07-19 ENCOUNTER — Ambulatory Visit (INDEPENDENT_AMBULATORY_CARE_PROVIDER_SITE_OTHER): Payer: Self-pay | Admitting: Nurse Practitioner

## 2023-07-19 ENCOUNTER — Encounter (INDEPENDENT_AMBULATORY_CARE_PROVIDER_SITE_OTHER): Payer: Self-pay | Admitting: Nurse Practitioner

## 2023-07-19 VITALS — BP 116/79 | HR 77 | Ht 67.0 in | Wt 225.4 lb

## 2023-07-19 DIAGNOSIS — I83812 Varicose veins of left lower extremities with pain: Secondary | ICD-10-CM | POA: Diagnosis not present

## 2023-07-19 DIAGNOSIS — I10 Essential (primary) hypertension: Secondary | ICD-10-CM | POA: Diagnosis not present

## 2023-07-19 NOTE — Progress Notes (Signed)
 Subjective:    Patient ID: Alexander Walters, male    DOB: Dec 22, 1975, 48 y.o.   MRN: [id number] Chief Complaint  Patient presents with   New Patient (Initial Visit)    Ref Harlon Flor consult asymptomatic varicose veins    The patient is seen for evaluation of symptomatic varicose veins. The patient relates burning and stinging which worsened steadily throughout the course of the day, particularly with standing. The patient also notes an aching and throbbing pain over the varicosities, particularly with prolonged dependent positions. The symptoms are significantly improved with elevation.  The patient also notes that during hot weather the symptoms are greatly intensified. The patient states the pain from the varicose veins interferes with work, daily exercise, shopping and household maintenance. At this point, the symptoms are persistent and severe enough that they're having a negative impact on lifestyle and are interfering with daily activities.  He also has significant cramping as well.  He notes that these varicosities appeared several years ago but recently they have gotten much worse and much more uncomfortable as noted above.  There is no history of DVT, PE or superficial thrombophlebitis. There is no history of ulceration or hemorrhage. The patient denies a significant family history of varicose veins.  The patient has not worn graduated compression for his varicosities.  He wore them after his surgery after his fall.  At the present time the patient has been using over-the-counter analgesics. There is no history of prior surgical intervention or sclerotherapy.      Review of Systems  Cardiovascular:  Positive for leg swelling.  All other systems reviewed and are negative.      Objective:   Physical Exam Vitals reviewed.  HENT:     Head: Normocephalic.  Cardiovascular:     Rate and Rhythm: Normal rate.     Pulses: Normal pulses.  Pulmonary:     Effort: Pulmonary effort is  normal.  Musculoskeletal:        General: Tenderness present.  Skin:    General: Skin is warm and dry.  Neurological:     Mental Status: He is alert and oriented to person, place, and time.  Psychiatric:        Mood and Affect: Mood normal.        Behavior: Behavior normal.        Thought Content: Thought content normal.        Judgment: Judgment normal.     BP 116/79   Pulse 77   Ht 5\' 7"  (1.702 m)   Wt 225 lb 6.4 oz (102.2 kg)   BMI 35.30 kg/m   Past Medical History:  Diagnosis Date   Cervicalgia    Chiari I malformation (HCC)    DDD (degenerative disc disease), cervical    Depression    GERD (gastroesophageal reflux disease)    Hematochezia    Hemorrhoids    Herniated cervical disc    History of kidney stones    Hypertension    Kidney stones    Microhematuria    Nephrolithiasis    Numbness and tingling in both hands    PONV (postoperative nausea and vomiting)    Sleep apnea     Social History   Socioeconomic History   Marital status: Single    Spouse name: Not on file   Number of children: Not on file   Years of education: Not on file   Highest education level: Not on file  Occupational History   Not on  file  Tobacco Use   Smoking status: Never   Smokeless tobacco: Current    Types: Chew   Tobacco comments:    Last dip 515am  Vaping Use   Vaping status: Never Used  Substance and Sexual Activity   Alcohol use: Yes    Comment: occasionally once a week   Drug use: No   Sexual activity: Not on file  Other Topics Concern   Not on file  Social History Narrative   Not on file   Social Drivers of Health   Financial Resource Strain: Low Risk  (11/22/2022)   Received from [facility]   Overall Financial Resource Strain (CARDIA)    Difficulty of Paying Living Expenses: Not very hard  Food Insecurity: No Food Insecurity (11/22/2022)   Received from [facility]   Hunger Vital Sign    Worried About Running Out of  Food in the Last Year: Never true    Ran Out of Food in the Last Year: Never true  Transportation Needs: No Transportation Needs (11/22/2022)   Received from [facility] - Transportation    In the past 12 months, has lack of transportation kept you from medical appointments or from getting medications?: No    Lack of Transportation (Non-Medical): No  Physical Activity: Inactive (09/27/2022)   Received from [facility]   Exercise Vital Sign    Days of Exercise per Week: 0 days    Minutes of Exercise per Session: 0 min  Stress: No Stress Concern Present (09/27/2022)   Received from [facility] - Occupational Stress Questionnaire    Feeling of Stress : Only a little  Social Connections: Moderately Integrated (09/27/2022)   Received from [facility]   Social Connection and Isolation Panel [NHANES]    Frequency of Communication with Friends and Family: Three times a week    Frequency of Social Gatherings with Friends and Family: Once a week    Attends Religious Services: 1 to 4 times per year    Active Member of [employer] or Organizations: No    Attends Banker Meetings: Never    Marital Status: Married  Catering manager Violence: Not on file    Past Surgical History:  Procedure Laterality Date   APPENDECTOMY     CHOLECYSTECTOMY     COLONOSCOPY     COLONOSCOPY WITH PROPOFOL N/A 09/17/2022   Procedure: COLONOSCOPY WITH PROPOFOL;  Surgeon: Jaynie Collins, DO;  Location: [facility] ENDOSCOPY;  Service: Gastroenterology;  Laterality: N/A;   COMPLEX ACETABULAR FRACTURE     FRACTURE SURGERY     KNEE SURGERY     LITHOTRIPSY     MUSCLE BIOPSY     SPINE SURGERY     SUBOCCIPITAL CRANIECTOMY CERVICAL LAMINECTOMY     TONSILLECTOMY      Family History  Problem Relation Age of Onset   Heart disease Father     No Known Allergies     Latest Ref Rng & Units  06/01/2019   12:00 PM 01/05/2015    1:17 AM 03/30/2009    8:34 AM  CBC  WBC 4.0 - 10.5 K/uL 4.7  4.9  7.2   Hemoglobin 13.0 - 17.0 g/dL 16.1  09.6  04.5   Hematocrit 39.0 - 52.0 % 44.8  43.9  47.4   Platelets 150 - 400 K/uL 184  132  170  CMP     Component Value Date/Time   NA 142 06/01/2019 1200   K 3.9 06/01/2019 1200   CL 108 06/01/2019 1200   CO2 24 06/01/2019 1200   GLUCOSE 104 (H) 06/01/2019 1200   BUN 17 06/01/2019 1200   CREATININE 0.92 06/01/2019 1200   CALCIUM 9.5 06/01/2019 1200   PROT 7.8 06/01/2019 1200   ALBUMIN 4.5 06/01/2019 1200   AST 29 06/01/2019 1200   ALT 38 06/01/2019 1200   ALKPHOS 55 06/01/2019 1200   BILITOT 0.9 06/01/2019 1200   GFRNONAA >60 06/01/2019 1200     No results found.     Assessment & Plan:   1. 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 The patient is CEAP [id number]   I have had a long discussion with the patient regarding  varicose veins and why they cause symptoms.  Patient will begin wearing graduated compression stockings class 1 on a daily basis, beginning first thing in the morning and removing them in the evening. The patient is instructed specifically not to sleep in the stockings.    The patient  will also begin using over-the-counter analgesics such as Motrin 600 mg po TID to help control the symptoms.    In addition, behavioral modification including elevation during the day will be initiated.    Further plans will be based on the ultrasound results and whether conservative therapies are successful at eliminating the pain and swelling.   2. Essential hypertension Continue antihypertensive medications as already ordered, these medications have been reviewed and there are no changes at this time.   Current Outpatient Medications on File Prior to Visit  Medication Sig Dispense Refill    cetirizine (ZYRTEC) 10 MG chewable tablet Chew 10 mg by mouth daily.     clomiPHENE (CLOMID) 50 MG tablet Take 50 mg by mouth daily.     ibuprofen (ADVIL,MOTRIN) 800 MG tablet Take 1 tablet (800 mg total) by mouth every 8 (eight) hours as needed for moderate pain. 15 tablet 0   losartan (COZAAR) 25 MG tablet Take 25 mg by mouth daily.     meclizine (ANTIVERT) 25 MG tablet Take 1 tablet (25 mg total) by mouth 3 (three) times daily as needed for dizziness (mild dizziness). 30 tablet 0   pravastatin (PRAVACHOL) 10 MG tablet Take 20 mg by mouth daily.     tamsulosin (FLOMAX) 0.4 MG CAPS capsule Take 0.4 mg by mouth daily.     escitalopram (LEXAPRO) 5 MG tablet Take 5 mg by mouth at bedtime.     ferrous sulfate 324 MG TBEC Take 324 mg by mouth daily with breakfast. (Patient not taking: Reported on 07/19/2023)     fluticasone (FLONASE) 50 MCG/ACT nasal spray Place 1 spray into both nostrils daily. 9.9 mL 1   oseltamivir (TAMIFLU) 75 MG capsule Take 1 capsule (75 mg total) by mouth every 12 (twelve) hours. (Patient not taking: Reported on 07/19/2023) 10 capsule 0   No current facility-administered medications on file prior to visit.    There are no Patient Instructions on file for this visit. No follow-ups on file.   Georgiana Spinner, NP

## 2023-08-10 ENCOUNTER — Ambulatory Visit (INDEPENDENT_AMBULATORY_CARE_PROVIDER_SITE_OTHER): Admitting: Urology

## 2023-08-10 VITALS — BP 117/79 | HR 80 | Ht 66.0 in | Wt 226.4 lb

## 2023-08-10 DIAGNOSIS — R399 Unspecified symptoms and signs involving the genitourinary system: Secondary | ICD-10-CM

## 2023-08-10 DIAGNOSIS — Z125 Encounter for screening for malignant neoplasm of prostate: Secondary | ICD-10-CM | POA: Diagnosis not present

## 2023-08-10 NOTE — Addendum Note (Signed)
 Addended by: Karyl Paget on: 08/10/2023 02:23 PM   Modules accepted: Orders

## 2023-08-10 NOTE — Progress Notes (Addendum)
 08/10/23 8:35 AM   Alexander Walters April 06, 1976 [id number]  CC: PSA screening, urinary symptoms, history of hypogonadism  HPI: 48 yo male here today for further evaluation of the above issues.  He had a fall off of a roof in 2019 with hip fractures and prolonged recovery.  He has had about 6 months of urinary symptoms with weak stream and nocturia, has been on Flomax  with essentially resolution of his symptoms since that time.  His only urinary complaint is some weak stream during first void in the morning.  He denies any dysuria or gross hematuria today.  He also has a history of low testosterone and was previously on Clomid through endocrinology, has been off of that medication since January 2025 after losing a significant amount of weight on Wegovy.  He also has a PSA value of 2.57 from March 2025, prior values of 1.46 in July 2024, and 1.03 in December 2023.   PMH: Past Medical History:  Diagnosis Date   Cervicalgia    Chiari I malformation (HCC)    DDD (degenerative disc disease), cervical    Depression    GERD (gastroesophageal reflux disease)    Hematochezia    Hemorrhoids    Herniated cervical disc    History of kidney stones    Hypertension    Kidney stones    Microhematuria    Nephrolithiasis    Numbness and tingling in both hands    PONV (postoperative nausea and vomiting)    Sleep apnea     Surgical History: Past Surgical History:  Procedure Laterality Date   APPENDECTOMY     CHOLECYSTECTOMY     COLONOSCOPY     COLONOSCOPY WITH PROPOFOL  N/A 09/17/2022   Procedure: COLONOSCOPY WITH PROPOFOL ;  Surgeon: Quintin Buckle, DO;  Location: [facility] ENDOSCOPY;  Service: Gastroenterology;  Laterality: N/A;   COMPLEX ACETABULAR FRACTURE     FRACTURE SURGERY     KNEE SURGERY     LITHOTRIPSY     MUSCLE BIOPSY     SPINE SURGERY     SUBOCCIPITAL CRANIECTOMY CERVICAL LAMINECTOMY     TONSILLECTOMY       Family History: Family History  Problem Relation Age of Onset    Heart disease Father     Social History:  reports that he has never smoked. His smokeless tobacco use includes chew. He reports current alcohol use. He reports that he does not use drugs.  Physical Exam: BP 117/79 (BP Location: Left Arm, Patient Position: Sitting, Cuff Size: Large)   Pulse 80   Ht 5\' 6"  (1.676 m)   Wt 226 lb 6.4 oz (102.7 kg)   BMI 36.54 kg/m    Constitutional:  Alert and oriented, No acute distress. Cardiovascular: No clubbing, cyanosis, or edema. Respiratory: Normal respiratory effort, no increased work of breathing. GI: Abdomen is soft, nontender, nondistended, no abdominal masses   Laboratory Data: Reviewed, see HPI  Pertinent Imaging: I have personally viewed and interpreted the CT scan dated 12/01/2022 showing a 38 g prostate, decompressed bladder, no hydronephrosis, plate and screw fixation of the left acetabulum and superior pubic ramus.  Assessment & Plan:   48 year old male with history of fall and pelvic fractures, on Flomax  for the last 6 months with significant improvement in his urinary symptoms.  Previously was on Clomid for low testosterone through January 2025.  Has had slow increase in PSA over the last 2 years, most recently 2.57 in March 2025, however PSA density very reassuring at 0.07 based  on recent CT.  We reviewed the controversies around PSA screening and the uncertainty surrounding it. In general, a man's PSA increases with age and is produced by both normal and cancerous prostate tissue. The differential diagnosis for elevated PSA includes BPH, prostate cancer, infection/prostatitis, recent intercourse/ejaculation, recent urethroscopic manipulation (foley placement/cystoscopy) or trauma.  We also reviewed the effect of testosterone on PSA.  Management of an elevated PSA can include observation/surveillance, prostate MRI, or prostate biopsy and we discussed this in detail. Our goal is to detect clinically significant prostate cancers, and manage  with either active surveillance, surgery, or radiation for localized disease. Risks of prostate biopsy include bleeding, infection (including life threatening sepsis), pain, and lower urinary symptoms. Hematuria, hematospermia, and blood in the stool are all common after biopsy and can persist up to 4 weeks.  I recommended a repeat PSA in 6 months, could consider prostate MRI or biopsy if >4 Continue Flomax , he is not interested in consideration of outlet procedures or UroLift at this time RTC 6 months IPSS, PVR, PSA reflex to free prior  Jay Meth, MD 08/10/2023  [facility] [address] [place], [place] [address] [phone]

## 2023-08-10 NOTE — Patient Instructions (Signed)
 We will repeat PSA in 6 months, be sure to avoid any ejaculations or sexual activity for at least 3 days prior to that blood test  Prostate Cancer Screening  Prostate cancer screening is testing that is done to check for the presence of prostate cancer in men. The prostate gland is a walnut-sized gland that is located below the bladder and in front of the rectum in males. The function of the prostate is to add fluid to semen during ejaculation. Prostate cancer is one of the most common types of cancer in men. Who should have prostate cancer screening? Screening recommendations vary based on age and other risk factors, as well as between the professional organizations who make the recommendations. In general, screening is recommended if: You are age 41 to 81 and have an average risk for prostate cancer. You should talk with your health care provider about your need for screening and how often screening should be done. Because most prostate cancers are slow growing and will not cause death, screening in this age group is generally reserved for men who have a 10- to 15-year life expectancy. You are younger than age 31, and you have these risk factors: Having a father, brother, or uncle who has been diagnosed with prostate cancer. The risk is higher if your family member's cancer occurred at an early age or if you have multiple family members with prostate cancer at an early age. Being a male who is [place] or is of [place] or sub-Saharan African descent. In general, screening is not recommended if: You are younger than age 98. You are between the ages of 70 and 35 and you have no risk factors. You are 8 years of age or older. At this age, the risks that screening can cause are greater than the benefits that it may provide. If you are at high risk for prostate cancer, your health care provider may recommend that you have screenings more often or that you start screening at a younger age. How is  screening for prostate cancer done? The recommended prostate cancer screening test is a blood test called the prostate-specific antigen (PSA) test. PSA is a protein that is made in the prostate. As you age, your prostate naturally produces more PSA. Abnormally high PSA levels may be caused by: Prostate cancer. An enlarged prostate that is not caused by cancer (benign prostatic hyperplasia, or BPH). This condition is very common in older men. A prostate gland infection (prostatitis) or urinary tract infection. Certain medicines such as male hormones (like testosterone) or other medicines that raise testosterone levels. A rectal exam may be done as part of prostate cancer screening to help provide information about the size of your prostate gland. When a rectal exam is performed, it should be done after the PSA level is drawn to avoid any effect on the results. Depending on the PSA results, you may need more tests, such as: A physical exam to check the size of your prostate gland, if not done as part of screening. Blood and imaging tests. A procedure to remove tissue samples from your prostate gland for testing (biopsy). This is the only way to know for certain if you have prostate cancer. What are the benefits of prostate cancer screening? Screening can help to identify cancer at an early stage, before symptoms start and when the cancer can be treated more easily. There is a small chance that screening may lower your risk of dying from prostate cancer. The chance is small  because prostate cancer is a slow-growing cancer, and most men with prostate cancer die from a different cause. What are the risks of prostate cancer screening? The main risk of prostate cancer screening is diagnosing and treating prostate cancer that would never have caused any symptoms or problems. This is called overdiagnosisand overtreatment. PSA screening cannot tell you if your PSA is high due to cancer or a different cause. A  prostate biopsy is the only procedure to diagnose prostate cancer. Even the results of a biopsy may not tell you if your cancer needs to be treated. Slow-growing prostate cancer may not need any treatment other than monitoring, so diagnosing and treating it may cause unnecessary stress or other side effects. Questions to ask your health care provider When should I start prostate cancer screening? What is my risk for prostate cancer? How often do I need screening? What type of screening tests do I need? How do I get my test results? What do my results mean? Do I need treatment? Where to find more information The American Cancer Society: www.cancer.org American Urological Association: www.auanet.org Contact a health care provider if: You have difficulty urinating. You have pain when you urinate or ejaculate. You have blood in your urine or semen. You have pain in your back or in the area of your prostate. Summary Prostate cancer is a common type of cancer in men. The prostate gland is located below the bladder and in front of the rectum. This gland adds fluid to semen during ejaculation. Prostate cancer screening may identify cancer at an early stage, when the cancer can be treated more easily and is less likely to have spread to other areas of the body. The prostate-specific antigen (PSA) test is the recommended screening test for prostate cancer, but it has associated risks. Discuss the risks and benefits of prostate cancer screening with your health care provider. If you are age 2 or older, the risks that screening can cause are greater than the benefits that it may provide. This information is not intended to replace advice given to you by your health care provider. Make sure you discuss any questions you have with your health care provider. Document Revised: 09/22/2020 Document Reviewed: 09/22/2020 Elsevier Patient Education  2024 [employer].

## 2023-08-30 ENCOUNTER — Encounter (INDEPENDENT_AMBULATORY_CARE_PROVIDER_SITE_OTHER): Payer: Self-pay

## 2023-09-01 ENCOUNTER — Ambulatory Visit (INDEPENDENT_AMBULATORY_CARE_PROVIDER_SITE_OTHER): Admitting: Nurse Practitioner

## 2023-09-01 ENCOUNTER — Encounter (INDEPENDENT_AMBULATORY_CARE_PROVIDER_SITE_OTHER)

## 2023-10-11 ENCOUNTER — Other Ambulatory Visit: Payer: Self-pay | Admitting: Family Medicine

## 2023-10-11 ENCOUNTER — Ambulatory Visit
Admission: RE | Admit: 2023-10-11 | Discharge: 2023-10-11 | Disposition: A | Discharge status: Home / Self Care | Source: Ambulatory Visit | Attending: Family Medicine | Admitting: Family Medicine

## 2023-10-11 DIAGNOSIS — R1032 Left lower quadrant pain: Secondary | ICD-10-CM | POA: Insufficient documentation

## 2023-10-11 MED ORDER — IOHEXOL 300 MG/ML  SOLN
100.0000 mL | Freq: Once | INTRAMUSCULAR | Status: AC | PRN
  Administered 2023-10-11: 100 mL via INTRAVENOUS

## 2024-02-14 ENCOUNTER — Ambulatory Visit: Admitting: Urology
